# Patient Record
Sex: Female | Born: 1985 | Race: White | Hispanic: No | Marital: Single | Smoking: Never smoker
Health system: Southern US, Community
[De-identification: ages and names within clinical notes are randomized; demographics above are authoritative.]

## PROBLEM LIST (undated history)

## (undated) DIAGNOSIS — E559 Vitamin D deficiency, unspecified: Secondary | ICD-10-CM

## (undated) DIAGNOSIS — M549 Dorsalgia, unspecified: Secondary | ICD-10-CM

## (undated) DIAGNOSIS — R0602 Shortness of breath: Secondary | ICD-10-CM

## (undated) DIAGNOSIS — R600 Localized edema: Secondary | ICD-10-CM

## (undated) DIAGNOSIS — R011 Cardiac murmur, unspecified: Secondary | ICD-10-CM

## (undated) DIAGNOSIS — E119 Type 2 diabetes mellitus without complications: Secondary | ICD-10-CM

## (undated) DIAGNOSIS — R7303 Prediabetes: Secondary | ICD-10-CM

## (undated) DIAGNOSIS — I10 Essential (primary) hypertension: Secondary | ICD-10-CM

## (undated) HISTORY — DX: Morbid (severe) obesity due to excess calories: E66.01

## (undated) HISTORY — DX: Type 2 diabetes mellitus without complications: E11.9

## (undated) HISTORY — DX: Hypomagnesemia: E83.42

## (undated) HISTORY — DX: Localized edema: R60.0

## (undated) HISTORY — DX: Prediabetes: R73.03

## (undated) HISTORY — DX: Essential (primary) hypertension: I10

## (undated) HISTORY — PX: CHOLECYSTECTOMY: SHX55

## (undated) HISTORY — DX: Shortness of breath: R06.02

## (undated) HISTORY — DX: Cardiac murmur, unspecified: R01.1

## (undated) HISTORY — DX: Vitamin D deficiency, unspecified: E55.9

## (undated) HISTORY — DX: Dorsalgia, unspecified: M54.9

---
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
## 2007-05-11 ENCOUNTER — Emergency Department (HOSPITAL_COMMUNITY): Admission: EM | Admit: 2007-05-11 | Discharge: 2007-05-11 | Payer: Self-pay | Admitting: *Deleted

## 2008-05-21 ENCOUNTER — Encounter (INDEPENDENT_AMBULATORY_CARE_PROVIDER_SITE_OTHER): Payer: Self-pay | Admitting: Internal Medicine

## 2008-05-21 ENCOUNTER — Ambulatory Visit: Payer: Self-pay

## 2008-08-15 ENCOUNTER — Emergency Department (HOSPITAL_COMMUNITY): Admission: EM | Admit: 2008-08-15 | Discharge: 2008-08-15 | Payer: Self-pay | Admitting: Emergency Medicine

## 2009-04-25 LAB — HM PAP SMEAR: HM Pap smear: NORMAL

## 2009-06-02 ENCOUNTER — Other Ambulatory Visit: Admission: RE | Admit: 2009-06-02 | Discharge: 2009-06-02 | Payer: Self-pay | Admitting: Internal Medicine

## 2010-07-29 LAB — URINALYSIS, ROUTINE W REFLEX MICROSCOPIC
Bilirubin Urine: NEGATIVE
Specific Gravity, Urine: 1.033 — ABNORMAL HIGH (ref 1.005–1.030)
Urobilinogen, UA: 0.2 mg/dL (ref 0.0–1.0)
pH: 5.5 (ref 5.0–8.0)

## 2010-07-29 LAB — BASIC METABOLIC PANEL
BUN: 9 mg/dL (ref 6–23)
CO2: 28 mEq/L (ref 19–32)
GFR calc non Af Amer: >60 mL/min (ref >60)
Glucose, Bld: 102 mg/dL — ABNORMAL HIGH (ref 70–99)
Potassium: 4.1 mEq/L (ref 3.5–5.1)
Sodium: 140 mEq/L (ref 135–145)

## 2010-09-04 NOTE — Op Note (Signed)
Kathryn Chan, Kathryn Chan              ACCOUNT NO.:  [id number]   MEDICAL RECORD NO.:  [id number]          PATIENT TYPE:  OIB   LOCATION:  2899                         FACILITY:  [facility]   PHYSICIAN:  Currie Paris, M.D.DATE OF BIRTH:  Apr 18, 1986   DATE OF PROCEDURE:  10/13/2004  DATE OF DISCHARGE:                                 OPERATIVE REPORT   [facility]S [phone].   PREOPERATIVE DIAGNOSIS:  Chronic calculus cholecystitis.   POSTOPERATIVE DIAGNOSIS:  Chronic calculous cholecystitis.   OPERATION:  Laparoscopic cholecystectomy with intraoperative cholangiogram.   SURGEON:  Dr. Jamey Ripa.   ASSISTANT:  Dr. Johna Sheriff.   ANESTHESIA:  General endotracheal.   CLINICAL HISTORY:  This a 25 year old who has had some significant weight  loss and developed  biliary type symptoms. Ultrasound showed what appeared  be some small stones in the gallbladder. After discussion with the patient  and her parents, we elected to proceed to cholecystectomy.   DESCRIPTION OF PROCEDURE:  The patient and her parents seen in the holding  area and we confirmed removal of the gallbladder as the planned procedure.  All questions had been answered and she was ready for surgery.   The patient taken to th operating room and after satisfactory general  endotracheal anesthesia obtained, the abdomen was prepped and draped. The  time-out occurred.   I used 0.25% plain Marcaine for each incision. I made an umbilical incision,  identified the fascia and opened direct vision. The peritoneal cavity was  entered and a pursestring placed in the fascia. The Hasson was introduced  and the abdomen insufflated to 15.   With the camera in place, we could see that there appeared to be no gross  abnormalities. Additional cannulas were placed in the epigastrium and right  mid abdomen.   The gallbladder retracted over the liver. The perineum over the cystic duct  opened and dissected out so we could identify the triangle of Calot.   I  could see the cystic duce nicely identified with the junction both with the  gallbladder and common duct. I could see the top of the cystic artery but  did not further dissect it out once we had a good window and could see that  we had the cystic duct nicely  dissected out.   Cystic duct was clipped once at its junction with the gallbladder and  opened. A Cook catheter was introduced and operative cholangiography  performed. This showed a fairly long cystic duct, good filling of the common  bile duct, common hepatic duct, with no filling defects, good emptying into  the duodenum and a little bit of filling of the pancreatic duct. This was  thought to be normal cholangiogram.   Catheter was removed and three clips placed on the cystic duct and it was  divided. Some small stranding tissue was clipped with single clips until the  cystic artery was dissected out and this was triple clipped and divided. The  gallbladder was removed from below to above with coagulation current of the  cautery. Just prior to disconnecting it, we checked the bed of gallbladder  and  made sure everything was dry. Made sure the clips appeared to be in good  position, etc. and everything looked fine.   The gallbladder was disconnected and brought out the umbilical port. The  umbilical port was occluded for few moments while we made a final check for  hemostasis and again everything appeared dry. The lateral ports removed and  there was no bleeding. The umbilical port was closed with a pursestring. The  abdomen was deflated through the epigastric port. Skin was closed with 4-0  Monocryl subcuticular and dermabond.   The patient tolerated the procedure well. No operative complications. All  counts were correct.       CJS/MEDQ  D:  10/13/2004  T:  10/13/2004  Job:  [id number]   cc:   Lucky Cowboy, M.D.  [address]  [place], [place] [address]  Fax: [phone]

## 2011-01-07 LAB — URINALYSIS, ROUTINE W REFLEX MICROSCOPIC
Ketones, ur: NEGATIVE
Nitrite: NEGATIVE
Protein, ur: NEGATIVE
Urobilinogen, UA: 0.2

## 2011-01-07 LAB — BASIC METABOLIC PANEL
CO2: 28
Calcium: 9
Creatinine, Ser: 0.62
Glucose, Bld: 96

## 2011-01-07 LAB — CBC
MCV: 90.1
Platelets: 250
RBC: 4.11
WBC: 7.8

## 2013-02-23 ENCOUNTER — Encounter: Payer: Self-pay | Admitting: Internal Medicine

## 2013-02-23 DIAGNOSIS — R7303 Prediabetes: Secondary | ICD-10-CM | POA: Insufficient documentation

## 2013-02-23 DIAGNOSIS — E669 Obesity, unspecified: Secondary | ICD-10-CM | POA: Insufficient documentation

## 2013-02-23 DIAGNOSIS — I10 Essential (primary) hypertension: Secondary | ICD-10-CM | POA: Insufficient documentation

## 2013-02-23 DIAGNOSIS — E559 Vitamin D deficiency, unspecified: Secondary | ICD-10-CM | POA: Insufficient documentation

## 2013-02-26 ENCOUNTER — Ambulatory Visit: Payer: 59 | Admitting: Physician Assistant

## 2013-06-16 ENCOUNTER — Other Ambulatory Visit: Payer: Self-pay | Admitting: Physician Assistant

## 2013-06-19 ENCOUNTER — Telehealth: Payer: Self-pay | Admitting: *Deleted

## 2013-06-19 ENCOUNTER — Other Ambulatory Visit: Payer: Self-pay

## 2013-06-19 NOTE — Telephone Encounter (Signed)
Return call to patient and left message on machine, advised her to call pharmacy as these prescriptions have already been processed

## 2013-06-19 NOTE — Telephone Encounter (Signed)
NEEDS REFILL = LISINOPRIL/HCTZ & LEVOTHYROXINE

## 2013-07-02 ENCOUNTER — Ambulatory Visit: Payer: Self-pay | Admitting: Physician Assistant

## 2013-07-06 ENCOUNTER — Encounter: Payer: Self-pay | Admitting: Physician Assistant

## 2013-07-17 ENCOUNTER — Encounter: Payer: Self-pay | Admitting: Physician Assistant

## 2013-07-24 ENCOUNTER — Other Ambulatory Visit: Payer: Self-pay | Admitting: Physician Assistant

## 2013-07-24 ENCOUNTER — Telehealth: Payer: Self-pay

## 2013-07-24 MED ORDER — PHENTERMINE HCL 37.5 MG PO TABS: 37.5000 mg | ORAL_TABLET | Freq: Every day | ORAL | Status: DC

## 2013-07-24 NOTE — Telephone Encounter (Signed)
Pt called requesting Rx for Phentermine. She said you told her to call today for Rx. [facility]

## 2013-08-07 ENCOUNTER — Ambulatory Visit (INDEPENDENT_AMBULATORY_CARE_PROVIDER_SITE_OTHER): Payer: 59 | Admitting: Physician Assistant

## 2013-08-07 ENCOUNTER — Encounter: Payer: Self-pay | Admitting: Physician Assistant

## 2013-08-07 VITALS — BP 128/72 | HR 76 | Temp 98.2°F | Resp 16 | Ht 67.0 in | Wt >= 6400 oz

## 2013-08-07 DIAGNOSIS — E559 Vitamin D deficiency, unspecified: Secondary | ICD-10-CM

## 2013-08-07 DIAGNOSIS — R7309 Other abnormal glucose: Secondary | ICD-10-CM

## 2013-08-07 DIAGNOSIS — R7303 Prediabetes: Secondary | ICD-10-CM

## 2013-08-07 DIAGNOSIS — I10 Essential (primary) hypertension: Secondary | ICD-10-CM

## 2013-08-07 LAB — CBC WITH DIFFERENTIAL/PLATELET
BASOS ABS: 0 10*3/uL (ref 0.0–0.1)
BASOS PCT: 0 % (ref 0–1)
EOS ABS: 0.2 10*3/uL (ref 0.0–0.7)
EOS PCT: 2 % (ref 0–5)
HCT: 40.5 % (ref 36.0–46.0)
Hemoglobin: 13.8 g/dL (ref 12.0–15.0)
Lymphocytes Relative: 29 % (ref 12–46)
Lymphs Abs: 2.5 10*3/uL (ref 0.7–4.0)
MCH: 29.9 pg (ref 26.0–34.0)
MCHC: 34.1 g/dL (ref 30.0–36.0)
MCV: 87.9 fL (ref 78.0–100.0)
Monocytes Absolute: 0.4 10*3/uL (ref 0.1–1.0)
Monocytes Relative: 5 % (ref 3–12)
Neutro Abs: 5.5 10*3/uL (ref 1.7–7.7)
Neutrophils Relative %: 64 % (ref 43–77)
PLATELETS: 315 10*3/uL (ref 150–400)
RBC: 4.61 MIL/uL (ref 3.87–5.11)
RDW: 13.4 % (ref 11.5–15.5)
WBC: 8.6 10*3/uL (ref 4.0–10.5)

## 2013-08-07 MED ORDER — LISINOPRIL-HYDROCHLOROTHIAZIDE 20-25 MG PO TABS: ORAL_TABLET | ORAL | Status: DC

## 2013-08-07 MED ORDER — LEVOTHYROXINE SODIUM 50 MCG PO TABS: ORAL_TABLET | ORAL | Status: DC

## 2013-08-07 MED ORDER — PHENTERMINE HCL 37.5 MG PO TABS: 37.5000 mg | ORAL_TABLET | Freq: Every day | ORAL | Status: DC

## 2013-08-07 NOTE — Patient Instructions (Signed)
Bad carbs also include fruit juice, alcohol, and sweet tea. These are empty calories that do not signal to your brain that you are full.   Please remember the good carbs are still carbs which convert into sugar. So please measure them out no more than 1/2-1 cup of rice, oatmeal, pasta, and beans.  Veggies are however free foods! Pile them on.   I like lean protein at every meal such as chicken, [place], pork chops, cottage cheese, etc. Just do not fry these meats and please center your meal around vegetable, the meats should be a side dish.   No all fruit is created equal. Please see the list below, the fruit at the bottom is higher in sugars than the fruit at the top   Phentermine  While taking the medication we will ask that you come into the office once a month to monitor your weight, blood pressure, and heart rate. In addition we can help answer your questions about diet, exercise, and help you every step of the way with your weight loss journey. Sometime it is helpful if you bring in a food diary or use an app on your phone such as myfitnesspal to record your calorie intake, especially in the beginning.   You can start out on 1/3 to 1/2 a pill in the morning and if you are tolerating it well you can increase to one pill daily.   What is this medicine? PHENTERMINE (FEN ter meen) decreases your appetite. This medicine is intended to be used in addition to a healthy reduced calorie diet and exercise. The best results are achieved this way. This medicine is only indicated for short-term use. Eventually your weight loss may level out and the medication will no longer be needed.   How should I use this medicine? Take this medicine by mouth. Follow the directions on the prescription label. The tablets should stay in the bottle until immediately before you take your dose. Take your doses at regular intervals. Do not take your medicine more often than directed.  Overdosage: If you think you have  taken too much of this medicine contact a poison control center or emergency room at once. NOTE: This medicine is only for you. Do not share this medicine with others.  What if I miss a dose? If you miss a dose, take it as soon as you can. If it is almost time for your next dose, take only that dose. Do not take double or extra doses. Do not increase or in any way change your dose without consulting your doctor.  What should I watch for while using this medicine? Notify your physician immediately if you become short of breath while doing your normal activities. Do not take this medicine within 6 hours of bedtime. It can keep you from getting to sleep. Avoid drinks that contain caffeine and try to stick to a regular bedtime every night. Do not stand or sit up quickly, especially if you are an older patient. This reduces the risk of dizzy or fainting spells. Avoid alcoholic drinks.  What side effects may I notice from receiving this medicine? Side effects that you should report to your doctor or health care professional as soon as possible: -chest pain, palpitations -depression or severe changes in mood -increased blood pressure -irritability -nervousness or restlessness -severe dizziness -shortness of breath -problems urinating -unusual swelling of the legs -vomiting  Side effects that usually do not require medical attention (report to your doctor or health  care professional if they continue or are bothersome): -blurred vision or other eye problems -changes in sexual ability or desire -constipation or diarrhea -difficulty sleeping -dry mouth or unpleasant taste -headache -nausea This list may not describe all possible side effects. Call your doctor for medical advice about side effects. You may report side effects to FDA at 1-800-FDA-1088.

## 2013-08-07 NOTE — Progress Notes (Signed)
HPI 28 y.o. female  presents for 3 month follow up with hypertension, hyperlipidemia, prediabetes and vitamin D. Her blood pressure has been controlled at home, today [name] is BP: 128/72 mmHg She does not workout at this time. She denies chest pain, shortness of breath, dizziness.  She is not on cholesterol medication and denies myalgias. Her cholesterol is at goal. The cholesterol last visit was:   She has been working on diet and exercise for prediabetes, and denies paresthesia of the feet, polydipsia, polyuria and visual disturbances. Last A1C in the office was:  Patient is on Vitamin D supplement.   She has morbid obesity, Body mass index is 63.89 kg/(m^2). and we started her on phentermine 2 weeks ago. She was unable to afford other weight loss medications and I was hesitant to put her on phentermine because she states in the past she has low potassium on it however we decided to put her on it for 2 weeks, she has only been on a half and we will check her labs today. She states she is feeling great, no increased palps, CP, SOB, dizziness, edema, muscle cramps. She has lost 11 lbs so far.  She is on thyroid medication. Her medication was not changed last visit. Patient denies heat / cold intolerance, nervousness and palpitations.    Current Medications:  Current Outpatient Prescriptions on File Prior to Visit  Medication Sig Dispense Refill  . cholecalciferol (VITAMIN D) 1000 UNITS tablet Take 1,000 Units by mouth daily.      Marland Kitchen levothyroxine (SYNTHROID, LEVOTHROID) 50 MCG tablet TAKE ONE-HALF TABLET BY MOUTH ONCE DAILY  30 tablet  1  . lisinopril-hydrochlorothiazide (PRINZIDE,ZESTORETIC) 20-25 MG per tablet TAKE ONE TABLET BY MOUTH ONCE DAILY  90 tablet  0  . Multiple Vitamin (MULTIVITAMIN) capsule Take 1 capsule by mouth daily.      . phentermine (ADIPEX-P) 37.5 MG tablet Take 1 tablet (37.5 mg total) by mouth daily before breakfast.  30 tablet  0   No current facility-administered  medications on file prior to visit.   Medical History:  Past Medical History  Diagnosis Date  . Prediabetes   . Vitamin D deficiency   . Hypertension   . Obesity, morbid, BMI 50 or higher   . Hypomagnesemia    Allergies:  Allergies  Allergen Reactions  . Phentermine     Hyperkalemia     Review of Systems: [X]  = complains of  [ ]  = denies  General: Fatigue [ ]  Fever [ ]  Chills [ ]  Weakness [ ]   Insomnia [ ]  Eyes: Redness [ ]  Blurred vision [ ]  Diplopia [ ]   ENT: Congestion [ ]  Sinus Pain [ ]  Post Nasal Drip [ ]  Sore Throat [ ]  Earache [ ]   Cardiac: Chest pain/pressure [ ]  SOB [ ]  Orthopnea [ ]   Palpitations [ ]   Paroxysmal nocturnal dyspnea[ ]  Claudication [ ]  Edema [ ]   Pulmonary: Cough [ ]  Wheezing[ ]   SOB [ ]   Snoring [ ]   GI: Nausea [ ]  Vomiting[ ]  Dysphagia[ ]  Heartburn[ ]  Abdominal pain [ ]  Constipation [ ] ; Diarrhea [ ] ; BRBPR [ ]  Melena[ ]  GU: Hematuria[ ]  Dysuria [ ]  Nocturia[ ]  Urgency [ ]   Hesitancy [ ]  Discharge [ ]  Neuro: Headaches[ ]  Vertigo[ ]  Paresthesias[ ]  Spasm [ ]  Speech changes [ ]  Incoordination [ ]   Ortho: Arthritis [ ]  Joint pain [ ]  Muscle pain [ ]  Joint swelling [ ]  Back Pain [ ]  Skin:  Rash [ ]   Pruritis [ ]  Change in skin lesion [ ]   Psych: Depression[ ]  Anxiety[ ]  Confusion [ ]  Memory loss [ ]   Heme/Lypmh: Bleeding [ ]  Bruising [ ]  Enlarged lymph nodes [ ]   Endocrine: Visual blurring [ ]  Paresthesia [ ]  Polyuria [ ]  Polydypsea [ ]    Heat/cold intolerance [ ]  Hypoglycemia [ ]   Family history- Review and unchanged Social history- Review and unchanged Physical Exam: BP 128/72  Pulse 76  Temp(Src) 98.2 F (36.8 C)  Resp 16  Ht 5\' 7"  (1.702 m)  Wt 408 lb (185.068 kg)  BMI 63.89 kg/m2  LMP 07/31/2013 Wt Readings from Last 3 Encounters:  08/07/13 408 lb (185.068 kg)   General Appearance: Well nourished, in no apparent distress. Eyes: PERRLA, EOMs, conjunctiva no swelling or erythema Sinuses: No Frontal/maxillary tenderness ENT/Mouth: Ext aud  canals clear, TMs without erythema, bulging. No erythema, swelling, or exudate on post pharynx.  Tonsils not swollen or erythematous. Hearing normal.  Neck: Supple, thyroid normal.  Respiratory: Respiratory effort normal, BS equal bilaterally without rales, rhonchi, wheezing or stridor.  Cardio: RRR with no MRGs. Brisk peripheral pulses without edema.  Abdomen: Soft, + BS.  Non tender, no guarding, rebound, hernias, masses. Lymphatics: Non tender without lymphadenopathy.  Musculoskeletal: Full ROM, 5/5 strength, normal gait.  Skin: Warm, dry without rashes, lesions, ecchymosis.  Neuro: Cranial nerves intact. Normal muscle tone, no cerebellar symptoms. Sensation intact.  Psych: Awake and oriented X 3, normal affect, Insight and Judgment appropriate.   Assessment and Plan:  Hypertension: Continue medication, monitor blood pressure at home. Continue DASH diet. Cholesterol: Continue diet and exercise. Check cholesterol.  Pre-diabetes-Continue diet and exercise. Check A1C Vitamin D Def- check level and continue medications.  Hypothyroidism-check TSH level, continue medications the same.  Obesity with co morbidities- long discussion about weight loss, diet, and exercise  Will start the patient on phentermine- hand out given  Follow up in 1 month with food diary   Continue diet and meds as discussed. Further disposition pending results of labs.  Vicie Mutters 3:26 PM

## 2013-08-08 LAB — VITAMIN D 25 HYDROXY (VIT D DEFICIENCY, FRACTURES): VIT D 25 HYDROXY: 51 ng/mL (ref 30–89)

## 2013-08-08 LAB — BASIC METABOLIC PANEL WITH GFR
BUN: 10 mg/dL (ref 6–23)
CALCIUM: 9.9 mg/dL (ref 8.4–10.5)
CO2: 29 meq/L (ref 19–32)
CREATININE: 0.59 mg/dL (ref 0.50–1.10)
Chloride: 96 mEq/L (ref 96–112)
GFR, Est African American: >89 mL/min
GFR, Est Non African American: >89 mL/min
Glucose, Bld: 86 mg/dL (ref 70–99)
Potassium: 4 mEq/L (ref 3.5–5.3)
Sodium: 135 mEq/L (ref 135–145)

## 2013-08-08 LAB — HEPATIC FUNCTION PANEL
ALBUMIN: 4.2 g/dL (ref 3.5–5.2)
ALT: 45 U/L — ABNORMAL HIGH (ref 0–35)
AST: 35 U/L (ref 0–37)
Alkaline Phosphatase: 45 U/L (ref 39–117)
BILIRUBIN TOTAL: 0.4 mg/dL (ref 0.2–1.2)
Bilirubin, Direct: 0.1 mg/dL (ref 0.0–0.3)
Indirect Bilirubin: 0.3 mg/dL (ref 0.2–1.2)
Total Protein: 7.1 g/dL (ref 6.0–8.3)

## 2013-08-08 LAB — MAGNESIUM: MAGNESIUM: 1.7 mg/dL (ref 1.5–2.5)

## 2013-08-08 LAB — LIPID PANEL
CHOL/HDL RATIO: 2.8 ratio
CHOLESTEROL: 118 mg/dL (ref 0–200)
HDL: 42 mg/dL (ref >39)
LDL Cholesterol: 57 mg/dL (ref 0–99)
Triglycerides: 94 mg/dL (ref <150)
VLDL: 19 mg/dL (ref 0–40)

## 2013-08-08 LAB — TSH: TSH: 1.513 u[IU]/mL (ref 0.350–4.500)

## 2013-08-08 LAB — HEMOGLOBIN A1C
HEMOGLOBIN A1C: 5.8 % — AB (ref <5.7)
MEAN PLASMA GLUCOSE: 120 mg/dL — AB (ref <117)

## 2013-08-08 LAB — INSULIN, FASTING: Insulin fasting, serum: 37 u[IU]/mL — ABNORMAL HIGH (ref 3–28)

## 2013-09-03 ENCOUNTER — Ambulatory Visit (INDEPENDENT_AMBULATORY_CARE_PROVIDER_SITE_OTHER): Payer: 59 | Admitting: Physician Assistant

## 2013-09-03 ENCOUNTER — Encounter: Payer: Self-pay | Admitting: Physician Assistant

## 2013-09-03 VITALS — BP 138/80 | HR 80 | Temp 98.1°F | Resp 16 | Wt 393.0 lb

## 2013-09-03 DIAGNOSIS — I10 Essential (primary) hypertension: Secondary | ICD-10-CM

## 2013-09-03 DIAGNOSIS — E559 Vitamin D deficiency, unspecified: Secondary | ICD-10-CM

## 2013-09-03 DIAGNOSIS — R7309 Other abnormal glucose: Secondary | ICD-10-CM

## 2013-09-03 DIAGNOSIS — R7303 Prediabetes: Secondary | ICD-10-CM

## 2013-09-03 LAB — HEPATIC FUNCTION PANEL
ALT: 52 U/L — ABNORMAL HIGH (ref 0–35)
AST: 44 U/L — ABNORMAL HIGH (ref 0–37)
Albumin: 4.3 g/dL (ref 3.5–5.2)
Alkaline Phosphatase: 47 U/L (ref 39–117)
Bilirubin, Direct: 0.1 mg/dL (ref 0.0–0.3)
Indirect Bilirubin: 0.4 mg/dL (ref 0.2–1.2)
Total Bilirubin: 0.5 mg/dL (ref 0.2–1.2)
Total Protein: 7.3 g/dL (ref 6.0–8.3)

## 2013-09-03 LAB — BASIC METABOLIC PANEL WITH GFR
BUN: 8 mg/dL (ref 6–23)
CHLORIDE: 99 meq/L (ref 96–112)
CO2: 29 mEq/L (ref 19–32)
Calcium: 10 mg/dL (ref 8.4–10.5)
Creat: 0.75 mg/dL (ref 0.50–1.10)
GFR, Est African American: >89 mL/min
Glucose, Bld: 95 mg/dL (ref 70–99)
POTASSIUM: 4.3 meq/L (ref 3.5–5.3)
Sodium: 137 mEq/L (ref 135–145)

## 2013-09-03 MED ORDER — LISINOPRIL-HYDROCHLOROTHIAZIDE 20-25 MG PO TABS: ORAL_TABLET | ORAL | Status: DC

## 2013-09-03 MED ORDER — PHENTERMINE HCL 37.5 MG PO TABS: 37.5000 mg | ORAL_TABLET | Freq: Every day | ORAL | Status: DC

## 2013-09-03 MED ORDER — LEVOTHYROXINE SODIUM 50 MCG PO TABS: ORAL_TABLET | ORAL | Status: DC

## 2013-09-03 NOTE — Progress Notes (Signed)
28 y.o.female presents for a follow up after being on phentermine for weight loss for 2 months. Patient states they have walking, fried to grill, twice the amount of water,doing myfitness pal, trying to pay attention to being full.  While on the phentermine they have lost 27 lbs since last visit. They deny palpitations, anxiety, trouble sleeping, elevated BP. She states her breathing and energy has improved.  Wt Readings from Last 3 Encounters:  09/03/13 393 lb (178.264 kg)  08/07/13 408 lb (185.068 kg)   Typical breakfast: still working on trying to do breakfast, boiled egg, protein oatmeal, yogurt, whole wheat english muffins Typical lunch: deli [place], thin cheeses, tomatoes, fruit, left overs from night before Typical dinner: fish and veggie with brown rice, grilled chicken, baked spaghetti.   Medications: Current Outpatient Prescriptions on File Prior to Visit  Medication Sig Dispense Refill  . cholecalciferol (VITAMIN D) 1000 UNITS tablet Take 1,000 Units by mouth daily.      Marland Kitchen levothyroxine (SYNTHROID, LEVOTHROID) 50 MCG tablet TAKE ONE-HALF TABLET BY MOUTH ONCE DAILY  90 tablet  0  . lisinopril-hydrochlorothiazide (PRINZIDE,ZESTORETIC) 20-25 MG per tablet TAKE ONE TABLET BY MOUTH ONCE DAILY  90 tablet  0  . Multiple Vitamin (MULTIVITAMIN) capsule Take 1 capsule by mouth daily.      . phentermine (ADIPEX-P) 37.5 MG tablet Take 1 tablet (37.5 mg total) by mouth daily before breakfast.  30 tablet  0   No current facility-administered medications on file prior to visit.    ROS: All negative except for above  Physical exam: Filed Vitals:   09/03/13 1204  BP: 138/80  Pulse: 80  Temp: 98.1 F (36.7 C)  Resp: 16   BP 138/80  Pulse 80  Temp(Src) 98.1 F (36.7 C)  Resp 16  Wt 393 lb (178.264 kg)  LMP 07/31/2013 General appearance: alert Throat: lips, mucosa, and tongue normal; teeth and gums normal Neck: no adenopathy, no carotid bruit, no JVD, supple, symmetrical, trachea  midline and thyroid not enlarged, symmetric, no tenderness/mass/nodules Lungs: clear to auscultation bilaterally Abdomen: soft, non-tender; bowel sounds normal; no masses,  no organomegaly  Assessment: Obesity with co morbid conditions.   Plan: General weight loss/lifestyle modification strategies discussed (elicit support from others; identify saboteurs; non-food rewards, etc). Behavioral treatment: stress management. Diet interventions: diet diary indefinitely. Informal exercise measures discussed, e.g. taking stairs instead of elevator. Regular aerobic exercise program discussed. Medication: phentermine. Follow up in: 2 months and as needed.

## 2013-09-03 NOTE — Patient Instructions (Signed)

## 2013-09-04 ENCOUNTER — Ambulatory Visit: Payer: Self-pay | Admitting: Physician Assistant

## 2013-11-07 ENCOUNTER — Ambulatory Visit: Payer: Self-pay | Admitting: Physician Assistant

## 2013-12-17 ENCOUNTER — Other Ambulatory Visit: Payer: Self-pay | Admitting: Physician Assistant

## 2013-12-27 ENCOUNTER — Ambulatory Visit: Payer: Self-pay | Admitting: Physician Assistant

## 2014-02-01 ENCOUNTER — Ambulatory Visit: Payer: Self-pay | Admitting: Physician Assistant

## 2014-02-08 ENCOUNTER — Ambulatory Visit (INDEPENDENT_AMBULATORY_CARE_PROVIDER_SITE_OTHER): Payer: 59 | Admitting: Physician Assistant

## 2014-02-08 ENCOUNTER — Encounter: Payer: Self-pay | Admitting: Physician Assistant

## 2014-02-08 VITALS — BP 128/82 | HR 80 | Temp 98.1°F | Resp 16 | Ht 67.0 in | Wt 394.0 lb

## 2014-02-08 DIAGNOSIS — E559 Vitamin D deficiency, unspecified: Secondary | ICD-10-CM

## 2014-02-08 DIAGNOSIS — R7309 Other abnormal glucose: Secondary | ICD-10-CM

## 2014-02-08 DIAGNOSIS — E785 Hyperlipidemia, unspecified: Secondary | ICD-10-CM

## 2014-02-08 DIAGNOSIS — R7303 Prediabetes: Secondary | ICD-10-CM

## 2014-02-08 DIAGNOSIS — I10 Essential (primary) hypertension: Secondary | ICD-10-CM

## 2014-02-08 LAB — CBC WITH DIFFERENTIAL/PLATELET
Basophils Absolute: 0 10*3/uL (ref 0.0–0.1)
Basophils Relative: 0 % (ref 0–1)
Eosinophils Absolute: 0.2 10*3/uL (ref 0.0–0.7)
Eosinophils Relative: 2 % (ref 0–5)
HEMATOCRIT: 40.4 % (ref 36.0–46.0)
HEMOGLOBIN: 13.3 g/dL (ref 12.0–15.0)
LYMPHS PCT: 30 % (ref 12–46)
Lymphs Abs: 2.7 10*3/uL (ref 0.7–4.0)
MCH: 29.8 pg (ref 26.0–34.0)
MCHC: 32.9 g/dL (ref 30.0–36.0)
MCV: 90.6 fL (ref 78.0–100.0)
MONO ABS: 0.5 10*3/uL (ref 0.1–1.0)
MONOS PCT: 5 % (ref 3–12)
NEUTROS ABS: 5.7 10*3/uL (ref 1.7–7.7)
Neutrophils Relative %: 63 % (ref 43–77)
Platelets: 346 10*3/uL (ref 150–400)
RBC: 4.46 MIL/uL (ref 3.87–5.11)
RDW: 13.3 % (ref 11.5–15.5)
WBC: 9 10*3/uL (ref 4.0–10.5)

## 2014-02-08 LAB — BASIC METABOLIC PANEL WITH GFR
BUN: 12 mg/dL (ref 6–23)
CHLORIDE: 99 meq/L (ref 96–112)
CO2: 29 mEq/L (ref 19–32)
Calcium: 9.7 mg/dL (ref 8.4–10.5)
Creat: 0.63 mg/dL (ref 0.50–1.10)
GFR, Est African American: >89 mL/min
GLUCOSE: 85 mg/dL (ref 70–99)
POTASSIUM: 4.3 meq/L (ref 3.5–5.3)
Sodium: 136 mEq/L (ref 135–145)

## 2014-02-08 LAB — HEPATIC FUNCTION PANEL
ALK PHOS: 51 U/L (ref 39–117)
ALT: 25 U/L (ref 0–35)
AST: 22 U/L (ref 0–37)
Albumin: 4.1 g/dL (ref 3.5–5.2)
BILIRUBIN DIRECT: 0.1 mg/dL (ref 0.0–0.3)
BILIRUBIN INDIRECT: 0.4 mg/dL (ref 0.2–1.2)
TOTAL PROTEIN: 6.7 g/dL (ref 6.0–8.3)
Total Bilirubin: 0.5 mg/dL (ref 0.2–1.2)

## 2014-02-08 LAB — LIPID PANEL
Cholesterol: 124 mg/dL (ref 0–200)
HDL: 47 mg/dL (ref >39)
LDL CALC: 59 mg/dL (ref 0–99)
Total CHOL/HDL Ratio: 2.6 Ratio
Triglycerides: 88 mg/dL (ref <150)
VLDL: 18 mg/dL (ref 0–40)

## 2014-02-08 LAB — HEMOGLOBIN A1C
Hgb A1c MFr Bld: 5.7 % — ABNORMAL HIGH (ref <5.7)
Mean Plasma Glucose: 117 mg/dL — ABNORMAL HIGH (ref <117)

## 2014-02-08 LAB — MAGNESIUM: Magnesium: 1.6 mg/dL (ref 1.5–2.5)

## 2014-02-08 LAB — TSH: TSH: 1.474 u[IU]/mL (ref 0.350–4.500)

## 2014-02-08 MED ORDER — PHENTERMINE HCL 37.5 MG PO TABS: 37.5000 mg | ORAL_TABLET | Freq: Every day | ORAL | Status: DC

## 2014-02-08 MED ORDER — LISINOPRIL-HYDROCHLOROTHIAZIDE 20-25 MG PO TABS: ORAL_TABLET | ORAL | Status: DC

## 2014-02-08 MED ORDER — LEVOTHYROXINE SODIUM 50 MCG PO TABS: ORAL_TABLET | ORAL | Status: DC

## 2014-02-08 NOTE — Progress Notes (Signed)
Assessment and Plan:  Hypertension: Continue medication, monitor blood pressure at home. Continue DASH diet.  Reminder to go to the ER if any CP, SOB, nausea, dizziness, severe HA, changes vision/speech, left arm numbness and tingling, and jaw pain. Cholesterol: Continue diet and exercise. Check cholesterol.  Pre-diabetes-Continue diet and exercise. Check A1C Vitamin D Def- check level and continue medications.  Obesity with co morbidities- long discussion about weight loss, diet, and exercise. Phentermine sent in   Continue diet and meds as discussed. Further disposition pending results of labs.  HPI 28 y.o. female  presents for 3 month follow up with hypertension, hyperlipidemia, prediabetes and vitamin D. Her blood pressure has been controlled at home, today [name] is BP: 128/82 mmHg She does workout. She denies chest pain, shortness of breath, dizziness.  She is not on cholesterol medication and denies myalgias. Her cholesterol is at goal. The cholesterol last visit was:   Lab Results  Component Value Date   CHOL 118 08/07/2013   HDL 42 08/07/2013   LDLCALC 57 08/07/2013   TRIG 94 08/07/2013   CHOLHDL 2.8 08/07/2013   She has been working on diet and exercise for prediabetes, and denies paresthesia of the feet, polydipsia, polyuria and visual disturbances. Last A1C in the office was:  Lab Results  Component Value Date   HGBA1C 5.8* 08/07/2013   Patient is on Vitamin D supplement.   Lab Results  Component Value Date   VD25OH 51 08/07/2013     BMI is Body mass index is 61.69 kg/(m^2)., she is working on diet and exercise and has done well, she has a fit bit, she is walking 6000 steps a day up from 3500,   Wt Readings from Last 3 Encounters:  02/08/14 394 lb (178.717 kg)  09/03/13 393 lb (178.264 kg)  08/07/13 408 lb (185.068 kg)    Current Medications:  Current Outpatient Prescriptions on File Prior to Visit  Medication Sig Dispense Refill  . cholecalciferol (VITAMIN D) 1000  UNITS tablet Take 1,000 Units by mouth daily.      Marland Kitchen levothyroxine (SYNTHROID, LEVOTHROID) 50 MCG tablet TAKE ONE-HALF TABLET BY MOUTH ONCE DAILY  90 tablet  0  . lisinopril-hydrochlorothiazide (PRINZIDE,ZESTORETIC) 20-25 MG per tablet TAKE ONE TABLET BY MOUTH ONCE DAILY  90 tablet  0  . Multiple Vitamin (MULTIVITAMIN) capsule Take 1 capsule by mouth daily.      . phentermine (ADIPEX-P) 37.5 MG tablet Take 1 tablet (37.5 mg total) by mouth daily before breakfast.  30 tablet  2   No current facility-administered medications on file prior to visit.   Medical History:  Past Medical History  Diagnosis Date  . Prediabetes   . Vitamin D deficiency   . Hypertension   . Obesity, morbid, BMI 50 or higher   . Hypomagnesemia    Allergies:  Allergies  Allergen Reactions  . Phentermine     Hyperkalemia     Review of Systems: [X]  = complains of  [ ]  = denies  General: Fatigue [ ]  Fever [ ]  Chills [ ]  Weakness [ ]   Insomnia [ ]  Eyes: Redness [ ]  Blurred vision [ ]  Diplopia [ ]   ENT: Congestion [ ]  Sinus Pain [ ]  Post Nasal Drip [ ]  Sore Throat [ ]  Earache [ ]   Cardiac: Chest pain/pressure [ ]  SOB [ ]  Orthopnea [ ]   Palpitations [ ]   Paroxysmal nocturnal dyspnea[ ]  Claudication [ ]  Edema [ ]   Pulmonary: Cough [ ]  Wheezing[ ]   SOB [ ]   Snoring [ ]   GI: Nausea [ ]  Vomiting[ ]  Dysphagia[ ]  Heartburn[ ]  Abdominal pain [ ]  Constipation [ ] ; Diarrhea [ ] ; BRBPR [ ]  Melena[ ]  GU: Hematuria[ ]  Dysuria [ ]  Nocturia[ ]  Urgency [ ]   Hesitancy [ ]  Discharge [ ]  Neuro: Headaches[ ]  Vertigo[ ]  Paresthesias[ ]  Spasm [ ]  Speech changes [ ]  Incoordination [ ]   Ortho: Arthritis [ ]  Joint pain [ ]  Muscle pain [ ]  Joint swelling [ ]  Back Pain [ ]  Skin:  Rash [ ]   Pruritis [ ]  Change in skin lesion [ ]   Psych: Depression[ ]  Anxiety[ ]  Confusion [ ]  Memory loss [ ]   Heme/Lypmh: Bleeding [ ]  Bruising [ ]  Enlarged lymph nodes [ ]   Endocrine: Visual blurring [ ]  Paresthesia [ ]  Polyuria [ ]  Polydypsea [ ]    Heat/cold  intolerance [ ]  Hypoglycemia [ ]   Family history- Review and unchanged Social history- Review and unchanged Physical Exam: BP 128/82  Pulse 80  Temp(Src) 98.1 F (36.7 C)  Resp 16  Ht 5\' 7"  (1.702 m)  Wt 394 lb (178.717 kg)  BMI 61.69 kg/m2 Wt Readings from Last 3 Encounters:  02/08/14 394 lb (178.717 kg)  09/03/13 393 lb (178.264 kg)  08/07/13 408 lb (185.068 kg)   General Appearance: Well nourished, in no apparent distress. Eyes: PERRLA, EOMs, conjunctiva no swelling or erythema Sinuses: No Frontal/maxillary tenderness ENT/Mouth: Ext aud canals clear, TMs without erythema, bulging. No erythema, swelling, or exudate on post pharynx.  Tonsils not swollen or erythematous. Hearing normal.  Neck: Supple, thyroid normal.  Respiratory: Respiratory effort normal, BS equal bilaterally without rales, rhonchi, wheezing or stridor.  Cardio: RRR with no MRGs. Brisk peripheral pulses without edema.  Abdomen: Soft, + BS, obese  Non tender, no guarding, rebound, hernias, masses. Lymphatics: Non tender without lymphadenopathy.  Musculoskeletal: Full ROM, 5/5 strength, normal gait.  Skin: Warm, dry without rashes, lesions, ecchymosis.  Neuro: Cranial nerves intact. Normal muscle tone, no cerebellar symptoms. Sensation intact.  Psych: Awake and oriented X 3, normal affect, Insight and Judgment appropriate.    Vicie Mutters, PA-C 10:52 AM [facility]

## 2014-02-09 LAB — VITAMIN D 25 HYDROXY (VIT D DEFICIENCY, FRACTURES): VIT D 25 HYDROXY: 62 ng/mL (ref 30–89)

## 2014-04-20 ENCOUNTER — Other Ambulatory Visit: Payer: Self-pay | Admitting: Internal Medicine

## 2014-04-20 ENCOUNTER — Telehealth: Payer: Self-pay | Admitting: Family

## 2014-04-20 DIAGNOSIS — J069 Acute upper respiratory infection, unspecified: Secondary | ICD-10-CM

## 2014-04-20 MED ORDER — PROMETHAZINE-DM 6.25-15 MG/5ML PO SYRP: ORAL_SOLUTION | ORAL | Status: DC

## 2014-04-20 MED ORDER — BENZONATATE 200 MG PO CAPS: ORAL_CAPSULE | ORAL | Status: DC

## 2014-04-20 MED ORDER — GUAIFENESIN-DM 100-10 MG/5ML PO SYRP: 5.0000 mL | ORAL_SOLUTION | ORAL | Status: DC | PRN

## 2014-04-20 MED ORDER — AZITHROMYCIN 250 MG PO TABS: ORAL_TABLET | ORAL | Status: DC

## 2014-04-20 NOTE — Progress Notes (Signed)
We are sorry that you are not feeling well.  Here is how we plan to help!  Based on what you have shared with me it looks like you have upper respiratory tract inflammation that has resulted in a signification cough.  Inflammation and infection in the upper respiratory tract is commonly called bronchitis and has four common causes:  Allergies, Viral Infections, Acid Reflux and Bacterial Infections.  Allergies, viruses and acid reflux are treated by controlling symptoms or eliminating the cause. An example might be a cough caused by taking certain blood pressure medications. You stop the cough by changing the medication. Another example might be a cough caused by acid reflux. Controlling the reflux helps control the cough.  Based on your presentation I believe you most likely have A cough due to bacteria.  When patients have a fever and a productive cough with a change in color or increased sputum production, we are concerned about bacterial bronchitis.  If left untreated it can progress to pneumonia.  If your symptoms do not improve with your treatment plan it is important that you contact your provider.   I have prescribed Azithromyin 250 mg: two tables now and then one tablet daily for 4 additonal days I have also prescribed Guaifenesin-dextromethorphan 100-10mg /80ml cough syrup. You may take 77ml every 4 hours as needed for cough.        HOME CARE . Only take medications as instructed by your medical team. . Complete the entire course of an antibiotic. . Drink plenty of fluids and get plenty of rest. . Avoid close contacts especially the very young and the elderly . Cover your mouth if you cough or cough into your sleeve. . Always remember to wash your hands . A steam or ultrasonic humidifier can help congestion.    GET HELP RIGHT AWAY IF: . You develop worsening fever. . You become short of breath . You cough up blood. . Your symptoms persist after you have completed your treatment plan MAKE  SURE YOU   Understand these instructions.  Will watch your condition.  Will get help right away if you are not doing well or get worse.  Your e-visit answers were reviewed by a board certified advanced clinical practitioner to complete your personal care plan.  Depending on the condition, your plan could have included both over the counter or prescription medications.  If there is a problem please reply  once you have received a response from your provider.  Your safety is important to [place].  If you have drug allergies check your prescription carefully.    You can use MyChart to ask questions about today's visit, request a non-urgent call back, or ask for a work or school excuse.  You will get an e-mail in the next two days asking about your experience.  I hope that your e-visit has been valuable and will speed your recovery. Thank you for using e-visits.

## 2014-04-24 ENCOUNTER — Ambulatory Visit (INDEPENDENT_AMBULATORY_CARE_PROVIDER_SITE_OTHER): Payer: 59 | Admitting: Physician Assistant

## 2014-04-24 ENCOUNTER — Ambulatory Visit (HOSPITAL_COMMUNITY)
Admission: RE | Admit: 2014-04-24 | Discharge: 2014-04-24 | Disposition: A | Discharge status: Home / Self Care | Payer: 59 | Source: Ambulatory Visit | Attending: Physician Assistant | Admitting: Physician Assistant

## 2014-04-24 ENCOUNTER — Telehealth: Payer: Self-pay | Admitting: Internal Medicine

## 2014-04-24 ENCOUNTER — Encounter: Payer: Self-pay | Admitting: Physician Assistant

## 2014-04-24 VITALS — BP 128/88 | HR 96 | Temp 99.8°F | Resp 18 | Ht 67.0 in | Wt 399.0 lb

## 2014-04-24 DIAGNOSIS — R0989 Other specified symptoms and signs involving the circulatory and respiratory systems: Secondary | ICD-10-CM | POA: Diagnosis not present

## 2014-04-24 DIAGNOSIS — R05 Cough: Secondary | ICD-10-CM | POA: Insufficient documentation

## 2014-04-24 DIAGNOSIS — R059 Cough, unspecified: Secondary | ICD-10-CM

## 2014-04-24 DIAGNOSIS — J209 Acute bronchitis, unspecified: Secondary | ICD-10-CM

## 2014-04-24 MED ORDER — ALBUTEROL SULFATE HFA 108 (90 BASE) MCG/ACT IN AERS: 2.0000 | INHALATION_SPRAY | RESPIRATORY_TRACT | Status: DC | PRN

## 2014-04-24 MED ORDER — PREDNISONE 20 MG PO TABS: ORAL_TABLET | ORAL | Status: AC

## 2014-04-24 MED ORDER — PROMETHAZINE-CODEINE 6.25-10 MG/5ML PO SYRP: 5.0000 mL | ORAL_SOLUTION | Freq: Four times a day (QID) | ORAL | Status: DC | PRN

## 2014-04-24 MED ORDER — DOXYCYCLINE HYCLATE 100 MG PO TABS: 100.0000 mg | ORAL_TABLET | Freq: Two times a day (BID) | ORAL | Status: DC

## 2014-04-24 MED ORDER — IPRATROPIUM-ALBUTEROL 0.5-2.5 (3) MG/3ML IN SOLN
3.0000 mL | Freq: Once | RESPIRATORY_TRACT | Status: AC
  Administered 2014-04-24: 3 mL via RESPIRATORY_TRACT

## 2014-04-24 NOTE — Telephone Encounter (Signed)
Error

## 2014-04-24 NOTE — Progress Notes (Addendum)
Subjective:    Patient ID: Kathryn Chan, female    DOB: [birth year], 29 y.o.   MRN: [id number]  Cough This is a new problem. Episode onset: 04/20/14. The problem has been unchanged. Cough characteristics: Was productive with some yellow. Associated symptoms include ear pain, a fever, headaches, postnasal drip, rhinorrhea, a sore throat, shortness of breath and wheezing. Pertinent negatives include no chills or rash. The symptoms are aggravated by lying down. Risk factors: whole family is sick at home. Treatments tried: Patient was seen by doctor using electronic visit and was given Z-Pak, robitussin and tessalon perles on 04/20/14.  Patient was not improving with cough so Dr. Melford Aase sent in Promethazine-DM and she states that helped a little.  Wheezing  Episode onset: started 04/20/14. The problem occurs constantly. The problem has been unchanged. Associated symptoms include coughing, ear pain, a fever, headaches, rhinorrhea, shortness of breath and a sore throat. Pertinent negatives include no chills or rash. The symptoms are aggravated by lying flat.  Fever  Maximum temperature: did not check temp at home. Associated symptoms include congestion, coughing, ear pain, headaches, a sore throat and wheezing. Pertinent negatives include no rash.   GFR= >89 on 02/08/14 Review of Systems  Constitutional: Positive for fever and diaphoresis. Negative for chills and fatigue.  HENT: Positive for congestion, ear pain, postnasal drip, rhinorrhea, sore throat and voice change. Negative for ear discharge, sinus pressure and trouble swallowing.   Eyes: Negative.   Respiratory: Positive for cough, chest tightness, shortness of breath and wheezing.   Cardiovascular: Negative.   Gastrointestinal: Negative.   Genitourinary: Negative.   Musculoskeletal: Negative.   Skin: Negative.  Negative for rash.  Neurological: Positive for headaches. Negative for dizziness and light-headedness.  Psychiatric/Behavioral: Negative.     Past Medical History  Diagnosis Date  . Prediabetes   . Vitamin D deficiency   . Hypertension   . Obesity, morbid, BMI 50 or higher   . Hypomagnesemia    Current Outpatient Prescriptions on File Prior to Visit  Medication Sig Dispense Refill  . azithromycin (ZITHROMAX) 250 MG tablet Take 2 tablets now and then 1 tablet daily x 4 days. 6 tablet 0  . benzonatate (TESSALON) 200 MG capsule Take 1 perle 3 x day to prevent cough 30 capsule 0  . cholecalciferol (VITAMIN D) 1000 UNITS tablet Take 1,000 Units by mouth daily.    Marland Kitchen levothyroxine (SYNTHROID, LEVOTHROID) 50 MCG tablet TAKE ONE-HALF TABLET BY MOUTH ONCE DAILY 90 tablet 1  . lisinopril-hydrochlorothiazide (PRINZIDE,ZESTORETIC) 20-25 MG per tablet TAKE ONE TABLET BY MOUTH ONCE DAILY 90 tablet 1  . Multiple Vitamin (MULTIVITAMIN) capsule Take 1 capsule by mouth daily.    . phentermine (ADIPEX-P) 37.5 MG tablet Take 1 tablet (37.5 mg total) by mouth daily before breakfast. 30 tablet 2  . promethazine-dextromethorphan (PROMETHAZINE-DM) 6.25-15 MG/5ML syrup Take 1 to 2 tsp every 4 hours as needed for severe cough 360 mL 0   No current facility-administered medications on file prior to visit.   Allergies  Allergen Reactions  . Phentermine     Hyperkalemia     BP 128/88 mmHg  Pulse 96  Temp(Src) 99.8 F (37.7 C) (Temporal)  Resp 18  Ht 5\' 7"  (1.702 m)  Wt 399 lb (180.985 kg)  BMI 62.48 kg/m2  SpO2 98%  LMP 04/10/2014 Wt Readings from Last 3 Encounters:  04/24/14 399 lb (180.985 kg)  02/08/14 394 lb (178.717 kg)  09/03/13 393 lb (178.264 kg)   Objective:   Physical Exam  Constitutional: She is oriented to person, place, and time. She appears well-developed and well-nourished. She has a sickly appearance. No distress.  Morbidly Obese.  HENT:  Head: Normocephalic.  Right Ear: External ear and ear canal normal. Tympanic membrane is bulging. Tympanic membrane is not injected and not erythematous.  Left Ear: External ear and  ear canal normal. Tympanic membrane is injected and retracted. Tympanic membrane is not erythematous and not bulging.  Nose: Nose normal. Right sinus exhibits no maxillary sinus tenderness and no frontal sinus tenderness. Left sinus exhibits no maxillary sinus tenderness and no frontal sinus tenderness.  Mouth/Throat: Uvula is midline and mucous membranes are normal. Mucous membranes are not pale and not dry. No trismus in the jaw. No uvula swelling. Posterior oropharyngeal erythema present. No oropharyngeal exudate, posterior oropharyngeal edema or tonsillar abscesses.  Right TM bulging with clear fluid and non-erythematous and non-edematous.  Left TM retracted and injected, but non-erythematous and non-edematous.  Eyes: Conjunctivae, EOM and lids are normal. Pupils are equal, round, and reactive to light. Right eye exhibits no discharge. Left eye exhibits no discharge. No scleral icterus.  Neck: Trachea normal and normal range of motion. Neck supple. No tracheal tenderness present. No tracheal deviation present.  Raspy voice  Cardiovascular: Normal rate, regular rhythm, S1 normal, S2 normal, normal heart sounds and normal pulses.  Exam reveals no gallop, no distant heart sounds and no friction rub.   No murmur heard. Pulmonary/Chest: Effort normal. No accessory muscle usage or stridor. No respiratory distress. She has decreased breath sounds. She has wheezes. She has no rhonchi. She has no rales. She exhibits no tenderness.  Wheezing and decreased breath sounds in all lung fields.  Abdominal: Soft. Bowel sounds are normal.  Lymphadenopathy:  No tenderness or LAD.  Neurological: She is alert and oriented to person, place, and time. No cranial nerve deficit. Gait normal.  Skin: Skin is warm, dry and intact. No rash noted. She is not diaphoretic. No pallor.  Psychiatric: She has a normal mood and affect. Her speech is normal and behavior is normal. Judgment and thought content normal. Cognition and  memory are normal.  Vitals reviewed.  Assessment & Plan:  1. Acute bronchitis, unspecified organism -Take Prednisone as prescribed for inflammation- predniSONE (DELTASONE) 20 MG tablet; Take 3 tablets PO QDaily for 3 days, then take 2 tablets PO QDaily for 3 days, then take 1 tablet PO QDaily for 3 days  Dispense: 18 tablet; Refill: 0 -Take Promethazine with Codeine as prescribed for cough- promethazine-codeine (PHENERGAN WITH CODEINE) 6.25-10 MG/5ML syrup; Take 5 mLs by mouth every 6 (six) hours as needed for cough. Max: 10mL per day  Dispense: 240 mL; Refill: 0 -Take Doxycycline as prescribed with food- doxycycline (VIBRA-TABS) 100 MG tablet; Take 1 tablet (100 mg total) by mouth 2 (two) times daily. For 7 days with food  Dispense: 14 tablet; Refill: 0 - Take Albuterol as prescribed for wheezing- albuterol (VENTOLIN HFA) 108 (90 BASE) MCG/ACT inhaler; Inhale 2 puffs into the lungs every 4 (four) hours as needed for wheezing or shortness of breath.  Dispense: 1 Inhaler; Refill: 1 -Gave DuoNeb in office and patient tolerated without complications- ipratropium-albuterol (DUONEB) 0.5-2.5 (3) MG/3ML nebulizer solution 3 mL; Take 3 mLs by nebulization once.  2. Cough Ordered chest x-ray to R/O pneumonia. - DG Chest 2 View; Future  Discussed medication effects and SE's.  Pt agreed to treatment plan. If you are not feeling better in 10-14 days, then please call the office. Please keep  your follow up appt on 05/17/14.  Addendum: Chest x-ray showed: "IMPRESSION: No active cardiopulmonary disease."  Please continue medications as prescribed.  If you are not feeling better in 10-14 days, then please call the office.   Shuntel Fishburn, Stephani Police, PA-C 8:36 AM [facility]

## 2014-04-24 NOTE — Patient Instructions (Addendum)
-Take Doxycycline as prescribed with food. -Take Prednisone as prescribed for inflammation -Take Promethazine-Codeine as prescribed for cough -Take Albuterol inhaler for wheezing. -Please go get chest x-ray at [facility] hospital   If you are not feeling better in 10-14 days, then please call the office. If your chest x-ray comes back negative, then please come back in 1 week.  Please make sure you are drinking plenty of water to stay hydrated.  Acute Bronchitis Bronchitis is inflammation of the airways that extend from the windpipe into the lungs (bronchi). The inflammation often causes mucus to develop. This leads to a cough, which is the most common symptom of bronchitis.  In acute bronchitis, the condition usually develops suddenly and goes away over time, usually in a couple weeks. Smoking, allergies, and asthma can make bronchitis worse. Repeated episodes of bronchitis may cause further lung problems.  CAUSES Acute bronchitis is most often caused by the same virus that causes a cold. The virus can spread from person to person (contagious) through coughing, sneezing, and touching contaminated objects. SIGNS AND SYMPTOMS   Cough.   Fever.   Coughing up mucus.   Body aches.   Chest congestion.   Chills.   Shortness of breath.   Sore throat.  DIAGNOSIS  Acute bronchitis is usually diagnosed through a physical exam. Your health care provider will also ask you questions about your medical history. Tests, such as chest X-rays, are sometimes done to rule out other conditions.  TREATMENT  Acute bronchitis usually goes away in a couple weeks. Oftentimes, no medical treatment is necessary. Medicines are sometimes given for relief of fever or cough. Antibiotic medicines are usually not needed but may be prescribed in certain situations. In some cases, an inhaler may be recommended to help reduce shortness of breath and control the cough. A cool mist vaporizer may also be used to  help thin bronchial secretions and make it easier to clear the chest.  HOME CARE INSTRUCTIONS  Get plenty of rest.   Drink enough fluids to keep your urine clear or pale yellow (unless you have a medical condition that requires fluid restriction). Increasing fluids may help thin your respiratory secretions (sputum) and reduce chest congestion, and it will prevent dehydration.   Take medicines only as directed by your health care provider.  If you were prescribed an antibiotic medicine, finish it all even if you start to feel better.  Avoid smoking and secondhand smoke. Exposure to cigarette smoke or irritating chemicals will make bronchitis worse. If you are a smoker, consider using nicotine gum or skin patches to help control withdrawal symptoms. Quitting smoking will help your lungs heal faster.   Reduce the chances of another bout of acute bronchitis by washing your hands frequently, avoiding people with cold symptoms, and trying not to touch your hands to your mouth, nose, or eyes.   Keep all follow-up visits as directed by your health care provider.  SEEK MEDICAL CARE IF: Your symptoms do not improve after 1 week of treatment.  SEEK IMMEDIATE MEDICAL CARE IF:  You develop an increased fever or chills.   You have chest pain.   You have severe shortness of breath.  You have bloody sputum.   You develop dehydration.  You faint or repeatedly feel like you are going to pass out.  You develop repeated vomiting.  You develop a severe headache. MAKE SURE YOU:   Understand these instructions.  Will watch your condition.  Will get help right away if you  are not doing well or get worse. Document Released: 05/13/2004 Document Revised: 08/20/2013 Document Reviewed: 09/26/2012 [facility] Patient Information 2015 [place], [place]. This information is not intended to replace advice given to you by your health care provider. Make sure you discuss any questions you have with your  health care provider.

## 2014-05-17 ENCOUNTER — Ambulatory Visit: Payer: Self-pay | Admitting: Physician Assistant

## 2014-06-26 ENCOUNTER — Ambulatory Visit: Payer: Self-pay | Admitting: Internal Medicine

## 2014-08-27 ENCOUNTER — Other Ambulatory Visit: Payer: Self-pay | Admitting: Physician Assistant

## 2014-09-23 ENCOUNTER — Other Ambulatory Visit: Payer: Self-pay | Admitting: Physician Assistant

## 2014-09-23 ENCOUNTER — Encounter: Payer: Self-pay | Admitting: Physician Assistant

## 2014-09-23 MED ORDER — LISINOPRIL-HYDROCHLOROTHIAZIDE 20-25 MG PO TABS: ORAL_TABLET | ORAL | Status: DC

## 2014-11-18 ENCOUNTER — Ambulatory Visit: Payer: Self-pay | Admitting: Physician Assistant

## 2014-12-17 ENCOUNTER — Encounter: Payer: Self-pay | Admitting: Physician Assistant

## 2014-12-17 ENCOUNTER — Other Ambulatory Visit: Payer: Self-pay | Admitting: Physician Assistant

## 2014-12-17 MED ORDER — LISINOPRIL-HYDROCHLOROTHIAZIDE 20-25 MG PO TABS: ORAL_TABLET | ORAL | Status: DC

## 2014-12-17 MED ORDER — LEVOTHYROXINE SODIUM 50 MCG PO TABS: ORAL_TABLET | ORAL | Status: DC

## 2014-12-19 ENCOUNTER — Ambulatory Visit: Payer: Self-pay | Admitting: Physician Assistant

## 2015-03-28 ENCOUNTER — Ambulatory Visit (INDEPENDENT_AMBULATORY_CARE_PROVIDER_SITE_OTHER): Payer: Commercial Managed Care - HMO | Admitting: Physician Assistant

## 2015-03-28 ENCOUNTER — Encounter: Payer: Self-pay | Admitting: Physician Assistant

## 2015-03-28 VITALS — BP 124/62 | HR 84 | Temp 97.9°F | Resp 16 | Ht 67.0 in | Wt >= 6400 oz

## 2015-03-28 DIAGNOSIS — R7303 Prediabetes: Secondary | ICD-10-CM | POA: Diagnosis not present

## 2015-03-28 DIAGNOSIS — E785 Hyperlipidemia, unspecified: Secondary | ICD-10-CM

## 2015-03-28 DIAGNOSIS — E559 Vitamin D deficiency, unspecified: Secondary | ICD-10-CM

## 2015-03-28 DIAGNOSIS — D649 Anemia, unspecified: Secondary | ICD-10-CM

## 2015-03-28 DIAGNOSIS — I10 Essential (primary) hypertension: Secondary | ICD-10-CM | POA: Diagnosis not present

## 2015-03-28 LAB — HEMOGLOBIN A1C
Hgb A1c MFr Bld: 5.8 % — ABNORMAL HIGH (ref <5.7)
MEAN PLASMA GLUCOSE: 120 mg/dL — AB (ref <117)

## 2015-03-28 LAB — CBC WITH DIFFERENTIAL/PLATELET
Basophils Absolute: 0.1 10*3/uL (ref 0.0–0.1)
Basophils Relative: 1 % (ref 0–1)
EOS ABS: 0.2 10*3/uL (ref 0.0–0.7)
Eosinophils Relative: 2 % (ref 0–5)
HCT: 44.2 % (ref 36.0–46.0)
Hemoglobin: 14.5 g/dL (ref 12.0–15.0)
Lymphocytes Relative: 27 % (ref 12–46)
Lymphs Abs: 2.8 10*3/uL (ref 0.7–4.0)
MCH: 30.1 pg (ref 26.0–34.0)
MCHC: 32.8 g/dL (ref 30.0–36.0)
MCV: 91.7 fL (ref 78.0–100.0)
MPV: 9.9 fL (ref 8.6–12.4)
Monocytes Absolute: 0.5 10*3/uL (ref 0.1–1.0)
Monocytes Relative: 5 % (ref 3–12)
Neutro Abs: 6.7 10*3/uL (ref 1.7–7.7)
Neutrophils Relative %: 65 % (ref 43–77)
PLATELETS: 346 10*3/uL (ref 150–400)
RBC: 4.82 MIL/uL (ref 3.87–5.11)
RDW: 13.1 % (ref 11.5–15.5)
WBC: 10.3 10*3/uL (ref 4.0–10.5)

## 2015-03-28 MED ORDER — LISINOPRIL-HYDROCHLOROTHIAZIDE 20-25 MG PO TABS: ORAL_TABLET | ORAL | Status: DC

## 2015-03-28 MED ORDER — LEVOTHYROXINE SODIUM 50 MCG PO TABS: ORAL_TABLET | ORAL | Status: DC

## 2015-03-28 MED ORDER — PHENTERMINE HCL 37.5 MG PO TABS: 37.5000 mg | ORAL_TABLET | Freq: Every day | ORAL | Status: DC

## 2015-03-28 NOTE — Patient Instructions (Signed)
Phentermine  While taking the medication we may ask that you come into the office once a month or once every 2-3 months to monitor your weight, blood pressure, and heart rate. In addition we can help answer your questions about diet, exercise, and help you every step of the way with your weight loss journey. Sometime it is helpful if you bring in a food diary or use an app on your phone such as myfitnesspal to record your calorie intake, especially in the beginning.   You can start out on 1/3 to 1/2 a pill in the morning and if you are tolerating it well you can increase to one pill daily. I also have some patients that take 1/3 or 1/2 at lunch to help prevent night time eating.  This medication is cheapest CASH pay at [facility] is 14-17 dollars and you do NOT need a membership to get meds from there.    What is this medicine? PHENTERMINE (FEN ter meen) decreases your appetite. This medicine is intended to be used in addition to a healthy reduced calorie diet and exercise. The best results are achieved this way. This medicine is only indicated for short-term use. Eventually your weight loss may level out and the medication will no longer be needed.   How should I use this medicine? Take this medicine by mouth. Follow the directions on the prescription label. The tablets should stay in the bottle until immediately before you take your dose. Take your doses at regular intervals. Do not take your medicine more often than directed.  Overdosage: If you think you have taken too much of this medicine contact a poison control center or emergency room at once. NOTE: This medicine is only for you. Do not share this medicine with others.  What if I miss a dose? If you miss a dose, take it as soon as you can. If it is almost time for your next dose, take only that dose. Do not take double or extra doses. Do not increase or in any way change your dose without consulting your  doctor.  What should I watch for while using this medicine? Notify your physician immediately if you become short of breath while doing your normal activities. Do not take this medicine within 6 hours of bedtime. It can keep you from getting to sleep. Avoid drinks that contain caffeine and try to stick to a regular bedtime every night. Do not stand or sit up quickly, especially if you are an older patient. This reduces the risk of dizzy or fainting spells. Avoid alcoholic drinks.  What side effects may I notice from receiving this medicine? Side effects that you should report to your doctor or health care professional as soon as possible: -chest pain, palpitations -depression or severe changes in mood -increased blood pressure -irritability -nervousness or restlessness -severe dizziness -shortness of breath -problems urinating -unusual swelling of the legs -vomiting  Side effects that usually do not require medical attention (report to your doctor or health care professional if they continue or are bothersome): -blurred vision or other eye problems -changes in sexual ability or desire -constipation or diarrhea -difficulty sleeping -dry mouth or unpleasant taste -headache -nausea This list may not describe all possible side effects. Call your doctor for medical advice about side effects. You may report side effects to FDA at 1-800-FDA-1088.   We want weight loss that will last so you should lose 1-2 pounds a week.  THAT IS IT!  Please pick THREE things a month to change. Once it is a habit check off the item. Then pick another three items off the list to become habits.  If you are already doing a habit on the list GREAT!  Cross that item off! o Don't drink your calories. Ie, alcohol, soda, fruit juice, and sweet tea.  o Drink more water. Drink a glass when you feel hungry or before each meal.  o Eat breakfast - Complex carb and protein (likeDannon light and fit yogurt, oatmeal, fruit,  eggs, [place] bacon). o Measure your cereal.  Eat no more than one cup a day. (ie [place]) o Eat an apple a day. o Add a vegetable a day. o Try a new vegetable a month. o Use Pam! Stop using oil or butter to cook. o Don't finish your plate or use smaller plates. o Share your dessert. o Eat sugar free Jello for dessert or frozen grapes. o Don't eat 2-3 hours before bed. o Switch to whole wheat bread, pasta, and brown rice. o Make healthier choices when you eat out. No fries! o Pick baked chicken, NOT fried. o Don't forget to SLOW DOWN when you eat. It is not going anywhere.  o Take the stairs. o Park far away in the parking lot o [employer] (or weights) for 10 minutes while watching TV. o Walk at work for 10 minutes during break. o Walk outside 1 time a week with your friend, kids, dog, or significant other. o Start a walking group at [place] the mall as much as you can tolerate.  o Keep a food diary. o Weigh yourself daily. o Walk for 15 minutes 3 days per week. o Cook at home more often and eat out less.  If life happens and you go back to old habits, it is okay.  Just start over. You can do it!   If you experience chest pain, get short of breath, or tired during the exercise, please stop immediately and inform your doctor.   Before you even begin to attack a weight-loss plan, it pays to remember this: You are not fat. You have fat. Losing weight isn't about blame or shame; it's simply another achievement to accomplish. Dieting is like any other skill-you have to buckle down and work at it. As long as you act in a smart, reasonable way, you'll ultimately get where you want to be. Here are some weight loss pearls for you.  1. It's Not a Diet. It's a Lifestyle Thinking of a diet as something you're on and suffering through only for the short term doesn't work. To shed weight and keep it off, you need to make permanent changes to the way you eat. It's OK to indulge occasionally,  of course, but if you cut calories temporarily and then revert to your old way of eating, you'll gain back the weight quicker than you can say yo-yo. Use it to lose it. Research shows that one of the best predictors of long-term weight loss is how many pounds you drop in the first month. For that reason, nutritionists often suggest being stricter for the first two weeks of your new eating strategy to build momentum. Cut out added sugar and alcohol and avoid unrefined carbs. After that, figure out how you can reincorporate them in a way that's healthy and maintainable.  2. There's a Right Way to Exercise Working out burns calories and fat and boosts your metabolism by building muscle. But those  trying to lose weight are notorious for overestimating the number of calories they burn and underestimating the amount they take in. Unfortunately, your system is biologically programmed to hold on to extra pounds and that means when you start exercising, your body senses the deficit and ramps up its hunger signals. If you're not diligent, you'll eat everything you burn and then some. Use it to lose it. Cardio gets all the exercise glory, but strength and interval training are the real heroes. They help you build lean muscle, which in turn increases your metabolism and calorie-burning ability 3. Don't Overreact to Mild Hunger Some people have a hard time losing weight because of hunger anxiety. To them, being hungry is bad-something to be avoided at all costs-so they carry snacks with them and eat when they don't need to. Others eat because they're stressed out or bored. While you never want to get to the point of being ravenous (that's when bingeing is likely to happen), a hunger pang, a craving, or the fact that it's 3:00 p.m. should not send you racing for the vending machine or obsessing about the energy bar in your purse. Ideally, you should put off eating until your stomach is growling and it's difficult to  concentrate.  Use it to lose it. When you feel the urge to eat, use the HALT method. Ask yourself, Am I really hungry? Or am I angry or anxious, lonely or bored, or tired? If you're still not certain, try the apple test. If you're truly hungry, an apple should seem delicious; if it doesn't, something else is going on. Or you can try drinking water and making yourself busy, if you are still hungry try a healthy snack.  4. Not All Calories Are Created Equal The mechanics of weight loss are pretty simple: Take in fewer calories than you use for energy. But the kind of food you eat makes all the difference. Processed food that's high in saturated fat and refined starch or sugar can cause inflammation that disrupts the hormone signals that tell your brain you're full. The result: You eat a lot more.  Use it to lose it. Clean up your diet. Swap in whole, unprocessed foods, including vegetables, lean protein, and healthy fats that will fill you up and give you the biggest nutritional bang for your calorie buck. In a few weeks, as your brain starts receiving regular hunger and fullness signals once again, you'll notice that you feel less hungry overall and naturally start cutting back on the amount you eat.  5. Protein, Produce, and Plant-Based Fats Are Your Weight-Loss Trinity Here's why eating the three Ps regularly will help you drop pounds. Protein fills you up. You need it to build lean muscle, which keeps your metabolism humming so that you can torch more fat. People in a weight-loss program who ate double the recommended daily allowance for protein (about 110 grams for a 150-pound woman) lost 70 percent of their weight from fat, while people who ate the RDA lost only about 40 percent, one study found. Produce is packed with filling fiber. "It's very difficult to consume too many calories if you're eating a lot of vegetables. Example: Three cups of broccoli is a lot of food, yet only 93 calories. (Fruit is  another story. It can be easy to overeat and can contain a lot of calories from sugar, so be sure to monitor your intake.) Plant-based fats like olive oil and those in avocados and nuts are healthy and extra  satiating.  Use it to lose it. Aim to incorporate each of the three Ps into every meal and snack. People who eat protein throughout the day are able to keep weight off, according to a study in the [place] of Clinical Nutrition. In addition to meat, poultry and seafood, good sources are beans, lentils, eggs, tofu, and yogurt. As for fat, keep portion sizes in check by measuring out salad dressing, oil, and nut butters (shoot for one to two tablespoons). Finally, eat veggies or a little fruit at every meal. People who did that consumed 308 fewer calories but didn't feel any hungrier than when they didn't eat more produce.  7. How You Eat Is As Important As What You Eat In order for your brain to register that you're full, you need to focus on what you're eating. Sit down whenever you eat, preferably at a table. Turn off the TV or computer, put down your phone, and look at your food. Smell it. Chew slowly, and don't put another bite on your fork until you swallow. When women ate lunch this attentively, they consumed 30 percent less when snacking later than those who listened to an audiobook at lunchtime, according to a study in the [place] of Nutrition. 8. Weighing Yourself Really Works The scale provides the best evidence about whether your efforts are paying off. Seeing the numbers tick up or down or stagnate is motivation to keep going-or to rethink your approach. A 2015 study at [facility] found that daily weigh-ins helped people lose more weight, keep it off, and maintain that loss, even after two years. Use it to lose it. Step on the scale at the same time every day for the best results. If your weight shoots up several pounds from one weigh-in to the next, don't freak out.  Eating a lot of salt the night before or having your period is the likely culprit. The number should return to normal in a day or two. It's a steady climb that you need to do something about. 9. Too Much Stress and Too Little Sleep Are Your Enemies When you're tired and frazzled, your body cranks up the production of cortisol, the stress hormone that can cause carb cravings. Not getting enough sleep also boosts your levels of ghrelin, a hormone associated with hunger, while suppressing leptin, a hormone that signals fullness and satiety. People on a diet who slept only five and a half hours a night for two weeks lost 55 percent less fat and were hungrier than those who slept eight and a half hours, according to a study in the [place]. Use it to lose it. Prioritize sleep, aiming for seven hours or more a night, which research shows helps lower stress. And make sure you're getting quality zzz's. If a snoring spouse or a fidgety cat wakes you up frequently throughout the night, you may end up getting the equivalent of just four hours of sleep, according to a study from [facility]. Keep pets out of the bedroom, and use a white-noise app to drown out snoring. 10. You Will Hit a plateau-And You Can Bust Through It As you slim down, your body releases much less leptin, the fullness hormone.  If you're not strength training, start right now. Building muscle can raise your metabolism to help you overcome a plateau. To keep your body challenged and burning calories, incorporate new moves and more intense intervals into your workouts or add another sweat session to  your weekly routine. Alternatively, cut an extra 100 calories or so a day from your diet. Now that you've lost weight, your body simply doesn't need as much fuel.   Ways to cut 100 calories  1. Eat your eggs with hot sauce OR salsa instead of cheese.  Eggs are great for breakfast, but many people consider eggs and  cheese to be BFFs. Instead of cheese-1 oz. of cheddar has 114 calories-top your eggs with hot sauce, which contains no calories and helps with satiety and metabolism. Salsa is also a great option!!  2. Top your toast, waffles or pancakes with mashed berries instead of jelly or syrup. Half a cup of berries-fresh, frozen or thawed-has about 40 calories, compared with 2 tbsp. of maple syrup or jelly, which both have about 100 calories. The berries will also give you a good punch of fiber, which helps keep you full and satisfied and won't spike blood sugar quickly like the jelly or syrup. 3. Swap the non-fat latte for black coffee with a splash of half-and-half. Contrary to its name, that non-fat latte has 130 calories and a startling 19g of carbohydrates per 16 oz. serving. Replacing that 'light' drinkable dessert with a black coffee with a splash of half-and-half saves you more than 100 calories per 16 oz. serving. 4. Sprinkle salads with freeze-dried raspberries instead of dried cranberries. If you want a sweet addition to your nutritious salad, stay away from dried cranberries. They have a whopping 130 calories per  cup and 30g carbohydrates. Instead, sprinkle freeze-dried raspberries guilt-free and save more than 100 calories per  cup serving, adding 3g of belly-filling fiber. 5. Go for mustard in place of mayo on your sandwich. Mustard can add really nice flavor to any sandwich, and there are tons of varieties, from spicy to honey. A serving of mayo is 95 calories, versus 10 calories in a serving of mustard. 6. Choose a DIY salad dressing instead of the store-bought kind. Mix Dijon or whole grain mustard with low-fat Kefir or red wine vinegar and garlic. 7. Use hummus as a spread instead of a dip. Use hummus as a spread on a high-fiber cracker or tortilla with a sandwich and save on calories without sacrificing taste. 8. Pick just one salad "accessory." Salad isn't automatically a calorie winner.  It's easy to over-accessorize with toppings. Instead of topping your salad with nuts, avocado and cranberries (all three will clock in at 313 calories), just pick one. The next day, choose a different accessory, which will also keep your salad interesting. You don't wear all your jewelry every day, right? 9. Ditch the white pasta in favor of spaghetti squash. One cup of cooked spaghetti squash has about 40 calories, compared with traditional spaghetti, which comes with more than 200. Spaghetti squash is also nutrient-dense. It's a good source of fiber and Vitamins A and C, and it can be eaten just like you would eat pasta-with a great tomato sauce and [place] meatballs or with pesto, tofu and spinach, for example. 10. Dress up your chili, soups and stews with non-fat [place] yogurt instead of sour cream. Just a 'dollop' of sour cream can set you back 115 calories and a whopping 12g of fat-seven of which are of the artery-clogging variety. Added bonus: [place] yogurt is packed with muscle-building protein, calcium and B Vitamins. 11. Mash cauliflower instead of mashed potatoes. One cup of traditional mashed potatoes-in all their creamy goodness-has more than 200 calories, compared to mashed cauliflower, which you can  typically eat for less than 100 calories per 1 cup serving. Cauliflower is a great source of the antioxidant indole-3-carbinol (I3C), which may help reduce the risk of some cancers, like breast cancer. 12. Ditch the ice cream sundae in favor of a [place] yogurt parfait. Instead of a cup of ice cream or fro-yo for dessert, try 1 cup of nonfat Greek yogurt topped with fresh berries and a sprinkle of cacao nibs. Both toppings are packed with antioxidants, which can help reduce cellular inflammation and oxidative damage. And the comparison is a no-brainer: One cup of ice cream has about 275 calories; one cup of frozen yogurt has about 230; and a cup of Greek yogurt has just 130, plus twice the protein, so  you're less likely to return to the freezer for a second helping. 13. Put olive oil in a spray container instead of using it directly from the bottle. Each tablespoon of olive oil is 120 calories and 15g of fat. Use a mister instead of pouring it straight into the pan or onto a salad. This allows for portion control and will save you more than 100 calories. 14. When baking, substitute canned pumpkin for butter or oil. Canned pumpkin-not pumpkin pie mix-is loaded with Vitamin A, which is important for skin and eye health, as well as immunity. And the comparisons are pretty crazy:  cup of canned pumpkin has about 40 calories, compared to butter or oil, which has more than 800 calories. Yes, 800 calories. Applesauce and mashed banana can also serve as good substitutions for butter or oil, usually in a 1:1 ratio. 15. Top casseroles with high-fiber cereal instead of breadcrumbs. Breadcrumbs are typically made with white bread, while breakfast cereals contain 5-9g of fiber per serving. Not only will you save more than 150 calories per  cup serving, the swap will also keep you more full and you'll get a metabolism boost from the added fiber. 16. Snack on pistachios instead of macadamia nuts. Believe it or not, you get the same amount of calories from 35 pistachios (100 calories) as you would from only five macadamia nuts. 17. Chow down on kale chips rather than potato chips. This is my favorite 'don't knock it 'till you try it' swap. Kale chips are so easy to make at home, and you can spice them up with a little grated parmesan or chili powder. Plus, they're a mere fraction of the calories of potato chips, but with the same crunch factor we crave so often. 18. Add seltzer and some fruit slices to your cocktail instead of soda or fruit juice. One cup of soda or fruit juice can pack on as much as 140 calories. Instead, use seltzer and fruit slices. The fruit provides valuable phytochemicals, such as flavonoids  and anthocyanins, which help to combat cancer and stave off the aging process.

## 2015-03-28 NOTE — Progress Notes (Signed)
Assessment and Plan:  Hypertension: Continue medication, monitor blood pressure at home. Continue DASH diet. Cholesterol: Continue diet and exercise. Check cholesterol.  Pre-diabetes-Continue diet and exercise. Check A1C Vitamin D Def- check level and continue medications.  Hypothyroidism-check TSH level, continue medications the same.  Fatigue- check labs Obesity with co morbidities- long discussion about weight loss, diet, and exercise  Will start the patient on phentermine- hand out given  Follow up in 1 month with food diary   Continue diet and meds as discussed. Further disposition pending results of labs  HPI 29 y.o. female  presents for long over due follow up with hypertension, hyperlipidemia, prediabetes and vitamin D. 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 She sleeps well at night. States she does not feel sad, she has some SOB with exertion, worse with a hill. No orhopnea, no PND, no CP.  Her blood pressure has been controlled at home, today [name] is BP: 124/62 mmHg She does not workout at this time. She denies chest pain, shortness of breath, dizziness.  She is not on cholesterol medication and denies myalgias. Her cholesterol is at goal. The cholesterol last visit was:   Lab Results  Component Value Date   CHOL 124 02/08/2014   HDL 47 02/08/2014   LDLCALC 59 02/08/2014   TRIG 88 02/08/2014   CHOLHDL 2.6 02/08/2014   She has been working on diet and exercise for prediabetes, and denies paresthesia of the feet, polydipsia, polyuria and visual disturbances. Last A1C in the office was:  Lab Results  Component Value Date   HGBA1C 5.7* 02/08/2014   Patient is on Vitamin D supplement.   Lab Results  Component Value Date   VD25OH 62 02/08/2014   BMI is Body mass index is 64.36 kg/(m^2)., she is working on diet and exercise. Wt Readings from Last 3 Encounters:  03/28/15 411 lb (186.428 kg)  04/24/14 399 lb  (180.985 kg)  02/08/14 394 lb (178.717 kg)   She is on thyroid medication, 1/2 of the pill daily. Her medication was not changed last visit. Patient denies heat / cold intolerance, nervousness and palpitations.  Lab Results  Component Value Date   TSH 1.474 02/08/2014    Current Medications:  Current Outpatient Prescriptions on File Prior to Visit  Medication Sig Dispense Refill  . cholecalciferol (VITAMIN D) 1000 UNITS tablet Take 1,000 Units by mouth daily.    Marland Kitchen levothyroxine (SYNTHROID, LEVOTHROID) 50 MCG tablet TAKE ONE-HALF TABLET BY MOUTH ONCE DAILY 90 tablet 0  . lisinopril-hydrochlorothiazide (PRINZIDE,ZESTORETIC) 20-25 MG per tablet TAKE ONE TABLET BY MOUTH ONCE DAILY 90 tablet 0  . Multiple Vitamin (MULTIVITAMIN) capsule Take 1 capsule by mouth daily.    . phentermine (ADIPEX-P) 37.5 MG tablet Take 1 tablet (37.5 mg total) by mouth daily before breakfast. (Patient not taking: Reported on 03/28/2015) 30 tablet 2   No current facility-administered medications on file prior to visit.   Medical History:  Past Medical History  Diagnosis Date  . Prediabetes   . Vitamin D deficiency   . Hypertension   . Obesity, morbid, BMI 50 or higher ([place])   . Hypomagnesemia    Allergies:  Allergies  Allergen Reactions  . Phentermine     Hyperkalemia    Review of Systems  Constitutional: Positive for malaise/fatigue. Negative for fever, chills, weight loss and diaphoresis.  HENT: Negative.   Eyes: Negative.   Respiratory: Negative.   Cardiovascular: Negative.   Gastrointestinal:  Negative.   Genitourinary: Negative.   Musculoskeletal: Negative.   Skin: Negative.   Neurological: Negative.  Negative for weakness.  Endo/Heme/Allergies: Negative.   Psychiatric/Behavioral: Negative.      Family history- Review and unchanged Social history- Review and unchanged Physical Exam: BP 124/62 mmHg  Pulse 84  Temp(Src) 97.9 F (36.6 C) (Temporal)  Resp 16  Ht 5\' 7"  (1.702 m)  Wt 411  lb (186.428 kg)  BMI 64.36 kg/m2  SpO2 98%  LMP 03/17/2015 Wt Readings from Last 3 Encounters:  03/28/15 411 lb (186.428 kg)  04/24/14 399 lb (180.985 kg)  02/08/14 394 lb (178.717 kg)   General Appearance: Well nourished, in no apparent distress. Eyes: PERRLA, EOMs, conjunctiva no swelling or erythema Sinuses: No Frontal/maxillary tenderness ENT/Mouth: Ext aud canals clear, TMs without erythema, bulging. No erythema, swelling, or exudate on post pharynx.  Tonsils not swollen or erythematous. Hearing normal.  Neck: Supple, thyroid normal.  Respiratory: Respiratory effort normal, BS equal bilaterally without rales, rhonchi, wheezing or stridor.  Cardio: RRR with no MRGs. Brisk peripheral pulses without edema.  Abdomen: Soft, + BS, morbidly obese, Non tender, no guarding, rebound, hernias, masses. Lymphatics: Non tender without lymphadenopathy.  Musculoskeletal: Full ROM, 5/5 strength, normal gait.  Skin: Warm, dry without rashes, lesions, ecchymosis.  Neuro: Cranial nerves intact. Normal muscle tone, no cerebellar symptoms. Sensation intact.  Psych: Awake and oriented X 3, normal affect, Insight and Judgment appropriate.    Vicie Mutters 11:53 AM

## 2015-03-29 LAB — HEPATIC FUNCTION PANEL
ALBUMIN: 4.1 g/dL (ref 3.6–5.1)
ALT: 38 U/L — ABNORMAL HIGH (ref 6–29)
AST: 31 U/L — AB (ref 10–30)
Alkaline Phosphatase: 50 U/L (ref 33–115)
BILIRUBIN TOTAL: 0.5 mg/dL (ref 0.2–1.2)
Bilirubin, Direct: 0.1 mg/dL (ref <=0.2)
Indirect Bilirubin: 0.4 mg/dL (ref 0.2–1.2)
TOTAL PROTEIN: 7.3 g/dL (ref 6.1–8.1)

## 2015-03-29 LAB — TSH: TSH: 1.516 u[IU]/mL (ref 0.350–4.500)

## 2015-03-29 LAB — BASIC METABOLIC PANEL WITH GFR
BUN: 12 mg/dL (ref 7–25)
CHLORIDE: 97 mmol/L — AB (ref 98–110)
CO2: 31 mmol/L (ref 20–31)
Calcium: 9.7 mg/dL (ref 8.6–10.2)
Creat: 0.63 mg/dL (ref 0.50–1.10)
GFR, Est African American: >89 mL/min (ref >=60)
GFR, Est Non African American: >89 mL/min (ref >=60)
GLUCOSE: 92 mg/dL (ref 65–99)
Potassium: 4.3 mmol/L (ref 3.5–5.3)
Sodium: 137 mmol/L (ref 135–146)

## 2015-03-29 LAB — INSULIN, FASTING: Insulin fasting, serum: 30.6 u[IU]/mL — ABNORMAL HIGH (ref 2.0–19.6)

## 2015-03-29 LAB — LIPID PANEL
CHOL/HDL RATIO: 2.6 ratio (ref <=5.0)
Cholesterol: 121 mg/dL — ABNORMAL LOW (ref 125–200)
HDL: 47 mg/dL (ref >=46)
LDL Cholesterol: 57 mg/dL (ref <130)
Triglycerides: 85 mg/dL (ref <150)
VLDL: 17 mg/dL (ref <30)

## 2015-03-29 LAB — MAGNESIUM: MAGNESIUM: 1.6 mg/dL (ref 1.5–2.5)

## 2015-03-29 LAB — VITAMIN D 25 HYDROXY (VIT D DEFICIENCY, FRACTURES): Vit D, 25-Hydroxy: 32 ng/mL (ref 30–100)

## 2015-03-29 LAB — IRON AND TIBC
%SAT: 24 % (ref 11–50)
IRON: 85 ug/dL (ref 40–190)
TIBC: 355 ug/dL (ref 250–450)
UIBC: 270 ug/dL (ref 125–400)

## 2015-03-29 LAB — VITAMIN B12: VITAMIN B 12: 667 pg/mL (ref 211–911)

## 2015-03-29 LAB — FERRITIN: Ferritin: 155 ng/mL (ref 10–291)

## 2015-06-16 ENCOUNTER — Ambulatory Visit: Payer: Self-pay | Admitting: Physician Assistant

## 2015-06-28 ENCOUNTER — Other Ambulatory Visit: Payer: Self-pay | Admitting: Internal Medicine

## 2015-06-28 ENCOUNTER — Other Ambulatory Visit: Payer: Self-pay | Admitting: Physician Assistant

## 2015-06-28 DIAGNOSIS — I10 Essential (primary) hypertension: Secondary | ICD-10-CM

## 2015-06-28 MED ORDER — LISINOPRIL-HYDROCHLOROTHIAZIDE 20-25 MG PO TABS: ORAL_TABLET | ORAL | Status: DC

## 2015-07-07 ENCOUNTER — Ambulatory Visit: Payer: Self-pay | Admitting: Physician Assistant

## 2015-07-26 ENCOUNTER — Telehealth: Payer: 59 | Admitting: Physician Assistant

## 2015-07-26 DIAGNOSIS — B9789 Other viral agents as the cause of diseases classified elsewhere: Principal | ICD-10-CM

## 2015-07-26 DIAGNOSIS — J069 Acute upper respiratory infection, unspecified: Secondary | ICD-10-CM

## 2015-07-26 MED ORDER — BENZONATATE 100 MG PO CAPS: 100.0000 mg | ORAL_CAPSULE | Freq: Three times a day (TID) | ORAL | Status: DC | PRN

## 2015-07-26 NOTE — Progress Notes (Signed)
We are sorry that you are not feeling well.  Here is how we plan to help!  Based on what you have shared with me it looks like you have upper respiratory tract inflammation that has resulted in a significant cough.  Inflammation and infection in the upper respiratory tract is commonly called bronchitis and has four common causes:  Allergies, Viral Infections, Acid Reflux and Bacterial Infections.  Allergies, viruses and acid reflux are treated by controlling symptoms or eliminating the cause. An example might be a cough caused by taking certain blood pressure medications. You stop the cough by changing the medication. Another example might be a cough caused by acid reflux. Controlling the reflux helps control the cough.  Based on your presentation I believe you most likely have A cough due to a virus.  This is called viral bronchitis and is best treated by rest, plenty of fluids and control of the cough.  You may use Ibuprofen or Tylenol as directed to help your symptoms.    In addition you may use A prescription cough medication called Tessalon Perles 100mg. You may take 1-2 capsules every 8 hours as needed for your cough.    HOME CARE . Only take medications as instructed by your medical team. . Complete the entire course of an antibiotic. . Drink plenty of fluids and get plenty of rest. . Avoid close contacts especially the very young and the elderly . Cover your mouth if you cough or cough into your sleeve. . Always remember to wash your hands . A steam or ultrasonic humidifier can help congestion.    GET HELP RIGHT AWAY IF: . You develop worsening fever. . You become short of breath . You cough up blood. . Your symptoms persist after you have completed your treatment plan MAKE SURE YOU   Understand these instructions.  Will watch your condition.  Will get help right away if you are not doing well or get worse.  Your e-visit answers were reviewed by a board certified advanced  clinical practitioner to complete your personal care plan.  Depending on the condition, your plan could have included both over the counter or prescription medications. If there is a problem please reply  once you have received a response from your provider. Your safety is important to us.  If you have drug allergies check your prescription carefully.    You can use MyChart to ask questions about today's visit, request a non-urgent call back, or ask for a work or school excuse for 24 hours related to this e-Visit. If it has been greater than 24 hours you will need to follow up with your provider, or enter a new e-Visit to address those concerns. You will get an e-mail in the next two days asking about your experience.  I hope that your e-visit has been valuable and will speed your recovery. Thank you for using e-visits.   

## 2015-08-15 ENCOUNTER — Ambulatory Visit: Payer: Self-pay | Admitting: Physician Assistant

## 2015-10-02 ENCOUNTER — Ambulatory Visit (INDEPENDENT_AMBULATORY_CARE_PROVIDER_SITE_OTHER): Payer: Commercial Managed Care - HMO | Admitting: Internal Medicine

## 2015-10-02 ENCOUNTER — Encounter: Payer: Self-pay | Admitting: Internal Medicine

## 2015-10-02 VITALS — BP 134/76 | HR 108 | Temp 98.2°F | Resp 20 | Ht 67.0 in

## 2015-10-02 DIAGNOSIS — I10 Essential (primary) hypertension: Secondary | ICD-10-CM

## 2015-10-02 MED ORDER — LISINOPRIL-HYDROCHLOROTHIAZIDE 20-25 MG PO TABS: ORAL_TABLET | ORAL | Status: DC

## 2015-10-02 MED ORDER — LEVOTHYROXINE SODIUM 50 MCG PO TABS: ORAL_TABLET | ORAL | Status: DC

## 2015-10-02 NOTE — Progress Notes (Signed)
Assessment and Plan:  Hypertension:  -Continue medication -monitor blood pressure at home. -Continue DASH diet -Reminder to go to the ER if any CP, SOB, nausea, dizziness, severe HA, changes vision/speech, left arm numbness and tingling and jaw pain.  Cholesterol - Continue diet and exercise -Check cholesterol.   PreDiabetes without complications -Continue diet and exercise.  -Check A1C  Vitamin D Def -check level -continue medications.   Morbid obesity -patient unable to tolerate phentermine -she wants to try lifestyle changes instead  Continue diet and meds as discussed. Further disposition pending results of labs. Discussed med's effects and SE's.    HPI 30 y.o. female  presents for 3 month follow up with hypertension, hyperlipidemia, diabetes and vitamin D deficiency.   Her blood pressure has been controlled at home, today [name] is BP: 134/76 mmHg.She does not workout. She denies chest pain, shortness of breath, dizziness.   She is on cholesterol medication and denies myalgias. Her cholesterol is at goal. The cholesterol was:  03/28/2015: Cholesterol 121*; HDL 47; LDL Cholesterol 57; Triglycerides 85   She has been working on diet and exercise for diabetes without complications, she is not on bASA, she is on ACE/ARB, and denies  foot ulcerations, hyperglycemia, hypoglycemia , increased appetite, nausea, paresthesia of the feet, polydipsia, polyuria, visual disturbances, vomiting and weight loss. Last A1C was: 03/28/2015: Hgb A1c MFr Bld 5.8*   Patient is on Vitamin D supplement. 03/28/2015: Vit D, 25-Hydroxy 32     Current Medications:  Current Outpatient Prescriptions on File Prior to Visit  Medication Sig Dispense Refill  . cholecalciferol (VITAMIN D) 1000 UNITS tablet Take 1,000 Units by mouth daily.    Marland Kitchen levothyroxine (SYNTHROID, LEVOTHROID) 50 MCG tablet TAKE ONE-HALF TABLET BY MOUTH ONCE DAILY 90 tablet 0  . lisinopril-hydrochlorothiazide (PRINZIDE,ZESTORETIC)  20-25 MG tablet TAKE ONE TABLET BY MOUTH ONCE DAILY 90 tablet 0  . Multiple Vitamin (MULTIVITAMIN) capsule Take 1 capsule by mouth daily.     No current facility-administered medications on file prior to visit.   Medical History:  Past Medical History  Diagnosis Date  . Prediabetes   . Vitamin D deficiency   . Hypertension   . Obesity, morbid, BMI 50 or higher ([place])   . Hypomagnesemia    Allergies:  Allergies  Allergen Reactions  . Phentermine     Hyperkalemia     Review of Systems:  Review of Systems  Constitutional: Negative for fever, weight loss, malaise/fatigue and diaphoresis.  HENT: Negative for congestion, ear discharge and sore throat.   Eyes: Negative.   Respiratory: Negative for cough, shortness of breath and wheezing.   Cardiovascular: Negative for chest pain, palpitations and leg swelling.  Gastrointestinal: Negative for heartburn, abdominal pain, diarrhea, constipation, blood in stool and melena.  Genitourinary: Negative.   Skin: Negative.   Neurological: Negative for dizziness, sensory change, loss of consciousness and headaches.  Psychiatric/Behavioral: Negative for depression. The patient is not nervous/anxious and does not have insomnia.     Family history- Review and unchanged  Social history- Review and unchanged  Physical Exam: BP 134/76 mmHg  Pulse 108  Temp(Src) 98.2 F (36.8 C) (Temporal)  Resp 20  Ht 5\' 7"  (1.702 m)  LMP 10/02/2015 Wt Readings from Last 3 Encounters:  03/28/15 411 lb (186.428 kg)  04/24/14 399 lb (180.985 kg)  02/08/14 394 lb (178.717 kg)   General Appearance: Well nourished well developed, non-toxic appearing, in no apparent distress. Eyes: PERRLA, EOMs, conjunctiva no swelling or erythema ENT/Mouth: Ear canals  clear with no erythema, swelling, or discharge.  TMs normal bilaterally, oropharynx clear, moist, with no exudate.   Neck: Supple, thyroid normal, no JVD, no cervical adenopathy.  Respiratory: Respiratory effort  normal, breath sounds clear A&P, no wheeze, rhonchi or rales noted.  No retractions, no accessory muscle usage Cardio: RRR with no MRGs. No noted edema.  Abdomen: Soft, + BS.  Non tender, no guarding, rebound, hernias, masses. Musculoskeletal: Full ROM, 5/5 strength, Normal gait Skin: Warm, dry without rashes, lesions, ecchymosis.  Neuro: Awake and oriented X 3, Cranial nerves intact. No cerebellar symptoms.  Psych: normal affect, Insight and Judgment appropriate.    Starlyn Skeans, PA-C 2:55 PM [facility]

## 2016-01-31 IMAGING — DX DG CHEST 2V
2 series · 2 of 2 positions shown · non-contrast
Comparison: None.

CLINICAL DATA: Cough and congestion

EXAM:
CHEST  2 VIEW

[chest pa]
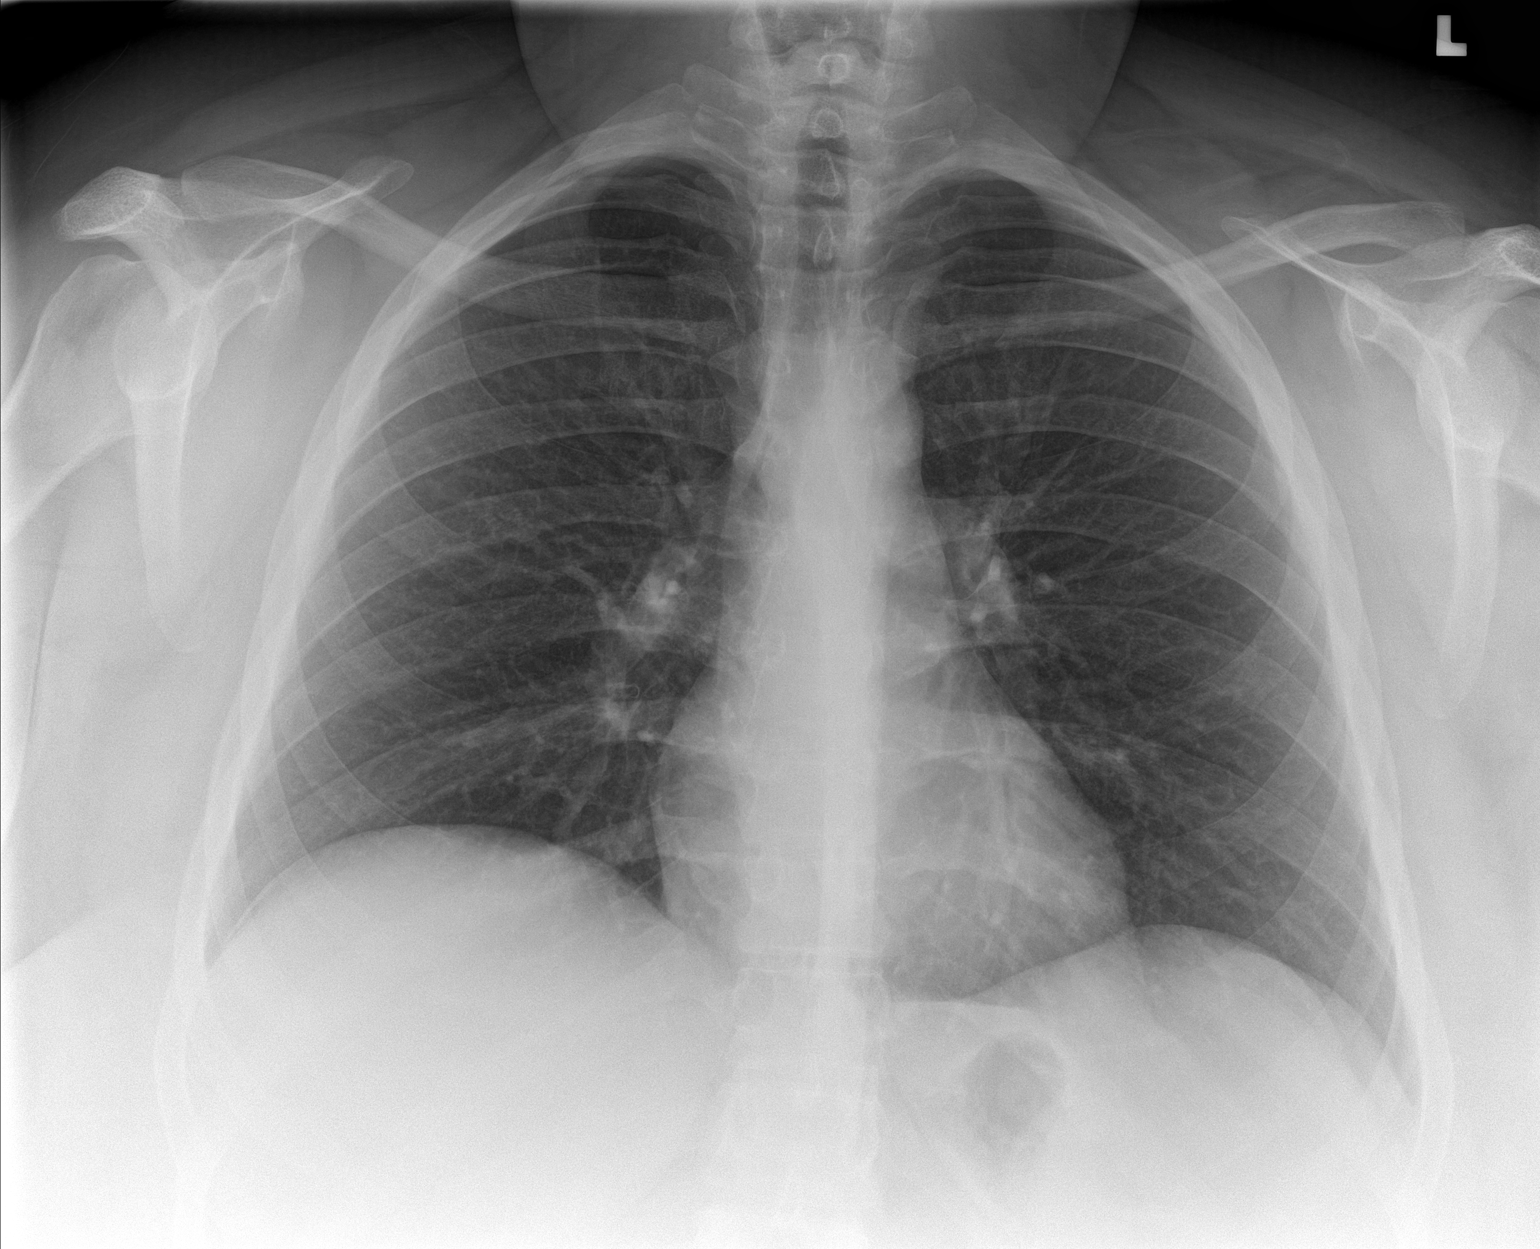

[chest lat]
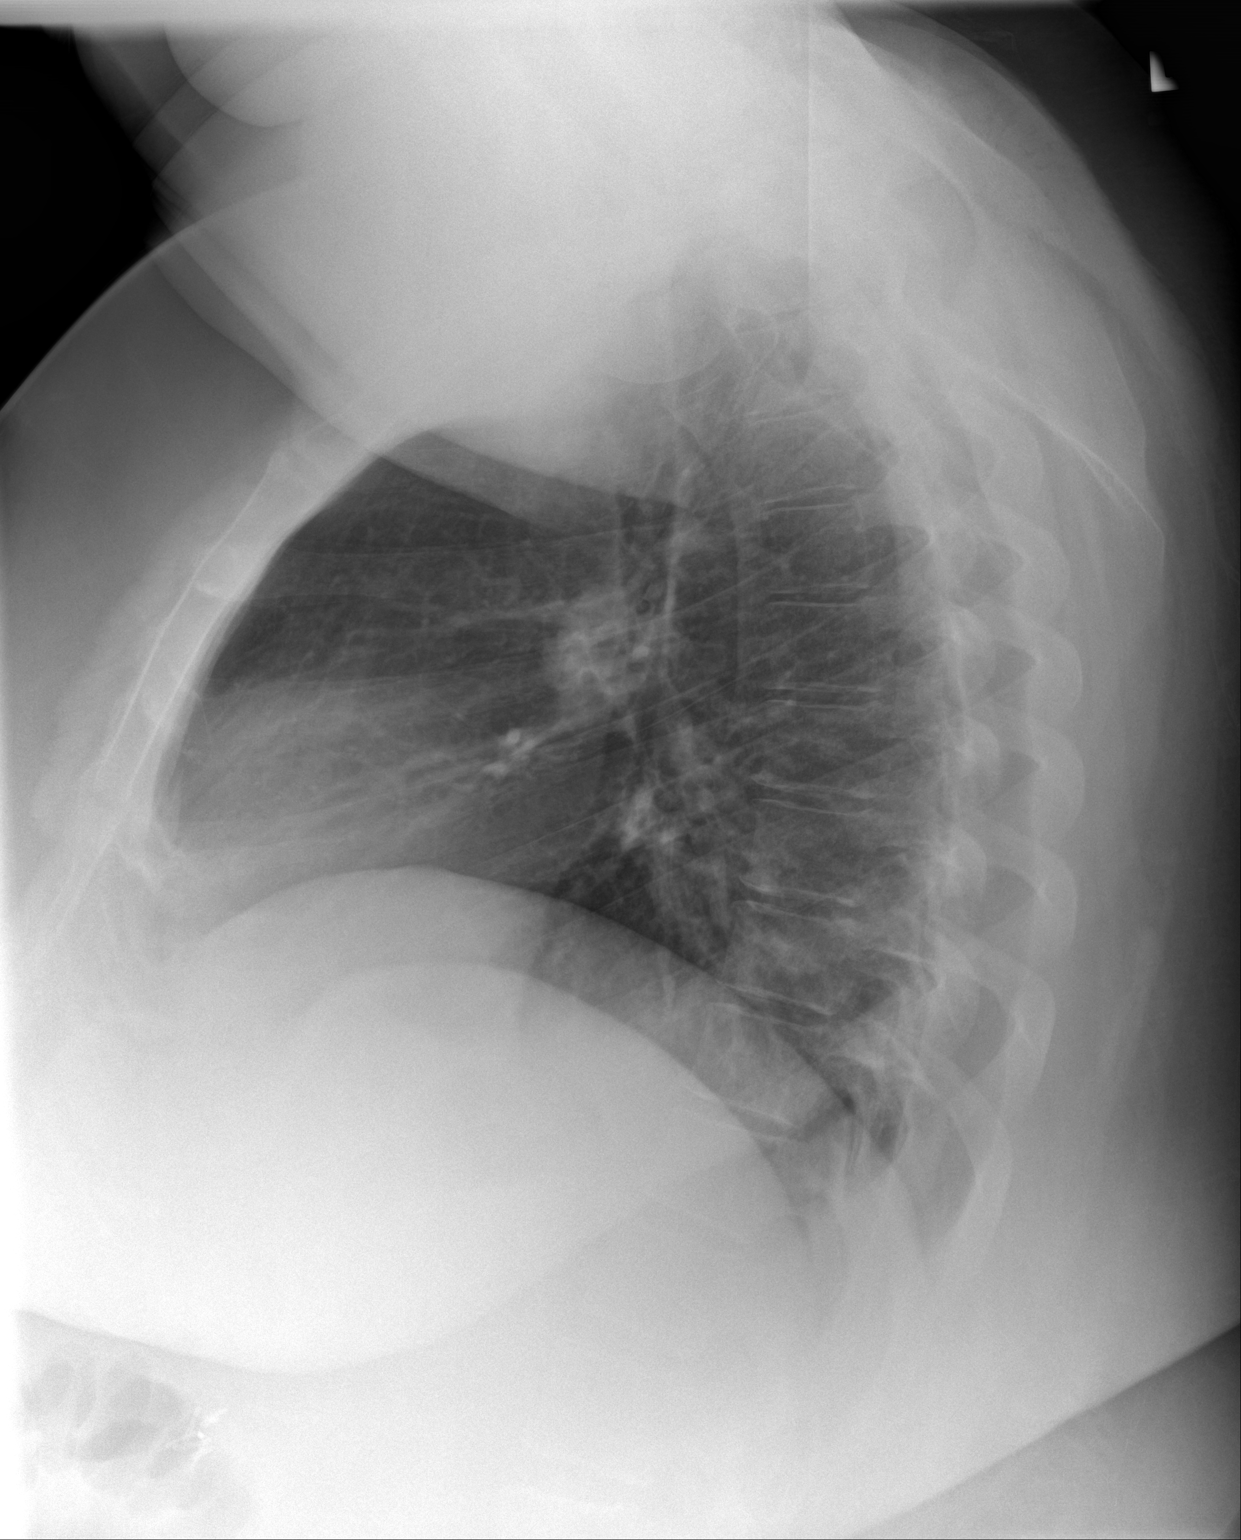

[2 of 2 positions shown; findings below may reference images not displayed]

FINDINGS: The heart size and mediastinal contours are within normal limits.
Both lungs are clear. The visualized skeletal structures are
unremarkable.
IMPRESSION: No active cardiopulmonary disease.

## 2016-04-02 ENCOUNTER — Ambulatory Visit: Payer: Self-pay | Admitting: Internal Medicine

## 2016-06-16 ENCOUNTER — Ambulatory Visit: Payer: Self-pay | Admitting: Internal Medicine

## 2016-06-25 DIAGNOSIS — L821 Other seborrheic keratosis: Secondary | ICD-10-CM | POA: Diagnosis not present

## 2016-06-25 DIAGNOSIS — L918 Other hypertrophic disorders of the skin: Secondary | ICD-10-CM | POA: Diagnosis not present

## 2016-06-25 DIAGNOSIS — D224 Melanocytic nevi of scalp and neck: Secondary | ICD-10-CM | POA: Diagnosis not present

## 2016-06-30 ENCOUNTER — Other Ambulatory Visit: Payer: Self-pay | Admitting: *Deleted

## 2016-06-30 DIAGNOSIS — I10 Essential (primary) hypertension: Secondary | ICD-10-CM

## 2016-06-30 MED ORDER — LISINOPRIL-HYDROCHLOROTHIAZIDE 20-25 MG PO TABS: ORAL_TABLET | ORAL | 0 refills | Status: DC

## 2016-07-05 ENCOUNTER — Ambulatory Visit: Payer: Self-pay | Admitting: Internal Medicine

## 2016-07-07 ENCOUNTER — Ambulatory Visit: Payer: Self-pay | Admitting: Internal Medicine

## 2016-07-21 ENCOUNTER — Ambulatory Visit (INDEPENDENT_AMBULATORY_CARE_PROVIDER_SITE_OTHER): Payer: Commercial Managed Care - HMO | Admitting: Internal Medicine

## 2016-07-21 ENCOUNTER — Encounter: Payer: Self-pay | Admitting: Internal Medicine

## 2016-07-21 VITALS — BP 128/76 | HR 90 | Temp 98.2°F | Resp 20 | Ht 67.0 in

## 2016-07-21 DIAGNOSIS — E782 Mixed hyperlipidemia: Secondary | ICD-10-CM

## 2016-07-21 DIAGNOSIS — E039 Hypothyroidism, unspecified: Secondary | ICD-10-CM | POA: Diagnosis not present

## 2016-07-21 DIAGNOSIS — R7303 Prediabetes: Secondary | ICD-10-CM

## 2016-07-21 DIAGNOSIS — Z79899 Other long term (current) drug therapy: Secondary | ICD-10-CM | POA: Diagnosis not present

## 2016-07-21 LAB — CBC WITH DIFFERENTIAL/PLATELET
BASOS ABS: 108 {cells}/uL (ref 0–200)
Basophils Relative: 1 %
EOS ABS: 216 {cells}/uL (ref 15–500)
Eosinophils Relative: 2 %
HEMATOCRIT: 42.2 % (ref 35.0–45.0)
HEMOGLOBIN: 13.4 g/dL (ref 11.7–15.5)
LYMPHS ABS: 2916 {cells}/uL (ref 850–3900)
Lymphocytes Relative: 27 %
MCH: 28.9 pg (ref 27.0–33.0)
MCHC: 31.8 g/dL — AB (ref 32.0–36.0)
MCV: 91.1 fL (ref 80.0–100.0)
MONO ABS: 540 {cells}/uL (ref 200–950)
MPV: 10.2 fL (ref 7.5–12.5)
Monocytes Relative: 5 %
NEUTROS PCT: 65 %
Neutro Abs: 7020 cells/uL (ref 1500–7800)
Platelets: 353 10*3/uL (ref 140–400)
RBC: 4.63 MIL/uL (ref 3.80–5.10)
RDW: 13.9 % (ref 11.0–15.0)
WBC: 10.8 10*3/uL (ref 3.8–10.8)

## 2016-07-21 LAB — HEPATIC FUNCTION PANEL
ALK PHOS: 50 U/L (ref 33–115)
ALT: 35 U/L — AB (ref 6–29)
AST: 28 U/L (ref 10–30)
Albumin: 4 g/dL (ref 3.6–5.1)
BILIRUBIN INDIRECT: 0.3 mg/dL (ref 0.2–1.2)
Bilirubin, Direct: 0.1 mg/dL (ref <=0.2)
TOTAL PROTEIN: 6.9 g/dL (ref 6.1–8.1)
Total Bilirubin: 0.4 mg/dL (ref 0.2–1.2)

## 2016-07-21 LAB — BASIC METABOLIC PANEL WITH GFR
BUN: 13 mg/dL (ref 7–25)
CALCIUM: 9.3 mg/dL (ref 8.6–10.2)
CHLORIDE: 100 mmol/L (ref 98–110)
CO2: 22 mmol/L (ref 20–31)
Creat: 0.66 mg/dL (ref 0.50–1.10)
GFR, Est African American: >89 mL/min (ref >=60)
GLUCOSE: 94 mg/dL (ref 65–99)
Potassium: 4.3 mmol/L (ref 3.5–5.3)
Sodium: 139 mmol/L (ref 135–146)

## 2016-07-21 LAB — LIPID PANEL
Cholesterol: 112 mg/dL (ref <200)
HDL: 40 mg/dL — ABNORMAL LOW (ref >50)
LDL CALC: 52 mg/dL (ref <100)
Total CHOL/HDL Ratio: 2.8 Ratio (ref <5.0)
Triglycerides: 100 mg/dL (ref <150)
VLDL: 20 mg/dL (ref <30)

## 2016-07-21 LAB — TSH: TSH: 1.6 m[IU]/L

## 2016-07-21 MED ORDER — NALTREXONE-BUPROPION HCL ER 8-90 MG PO TB12: ORAL_TABLET | ORAL | 3 refills | Status: DC

## 2016-07-21 MED ORDER — CLOTRIMAZOLE-BETAMETHASONE 1-0.05 % EX CREA: TOPICAL_CREAM | CUTANEOUS | 1 refills | Status: DC

## 2016-07-21 NOTE — Progress Notes (Signed)
Assessment and Plan:   1. Mixed hyperlipidemia -cont diet and exercise - Lipid panel  2. Prediabetes -cont diet and exercise - Hemoglobin A1c  3. Hypothyroidism, unspecified type -cont levothyroxine -dose adjust if necessary - TSH  4. Morbid obesity ([place]) -if contrave not covered will send in 150 mg of wellbutrin -recommended getting a personal trainer for accountability -will send to nutrition for education on healthy diet -may eventually need to have help from [place] - Ambulatory referral to diabetic education - Naltrexone-Bupropion HCl ER 8-90 MG TB12; Take 2 tablets twice daily with meals.  Dispense: 360 tablet; Refill: 3  5. Medication management  - CBC with Differential/Platelet - BASIC METABOLIC PANEL WITH GFR - Hepatic function panel       HPI 31 y.o.female presents for 6 month follow-up of her morbid obesity.  She reports that she is having a hard time with her diet.  She reports that her weight is increasing a lot and she is having a hard time with her joints and also with her breathing.  She is gaining weight again.  She reports that she is not sure what her weight is.  She reports that she can't find scales that can weight her very often.    Her blood pressure has been doing well.  She is taking her medictions.  She does not check it at home regularly.   She is thirsty a lot.  She reports that she is concerned that this means she has diabetes.    She is not exercising.  She is wanting to try contrave as she could not tolerate the phentermine.    She is taking her thyroid medication first thing in the morning on an empty stomach.    Past Medical History:  Diagnosis Date  . Hypertension   . Hypomagnesemia   . Obesity, morbid, BMI 50 or higher ([place])   . Prediabetes   . Vitamin D deficiency      Allergies  Allergen Reactions  . Phentermine     Hyperkalemia      Current Outpatient Prescriptions on File Prior to Visit  Medication Sig Dispense  Refill  . cholecalciferol (VITAMIN D) 1000 UNITS tablet Take 1,000 Units by mouth daily.    Marland Kitchen levothyroxine (SYNTHROID, LEVOTHROID) 50 MCG tablet TAKE ONE-HALF TABLET BY MOUTH ONCE DAILY 90 tablet 2  . lisinopril-hydrochlorothiazide (PRINZIDE,ZESTORETIC) 20-25 MG tablet TAKE ONE TABLET BY MOUTH ONCE DAILY 30 tablet 0  . Multiple Vitamin (MULTIVITAMIN) capsule Take 1 capsule by mouth daily.     No current facility-administered medications on file prior to visit.     ROS: all negative except above.   Physical Exam: There were no vitals filed for this visit. BP 128/76   Pulse 90   Temp 98.2 F (36.8 C) (Temporal)   Resp 20   Ht 5\' 7"  (1.702 m)   LMP 07/19/2016  General Appearance: Severely obese, Well developed well nourished, non-toxic appearing in no apparent distress. Eyes: PERRLA, EOMs, conjunctiva w/ no swelling or erythema or discharge Sinuses: No Frontal/maxillary tenderness ENT/Mouth: Ear canals clear without swelling or erythema.  TM's normal bilaterally with no retractions, bulging, or loss of landmarks.   Neck: Supple, thyroid normal, no notable JVD  Respiratory: Respiratory effort normal, Clear breath sounds anteriorly and posteriorly bilaterally without rales, rhonchi, wheezing or stridor. No retractions or accessory muscle usage. Cardio: RRR with no MRGs.   Abdomen: Soft, + BS.  Non tender, no guarding, rebound, hernias, masses.  Musculoskeletal: Full ROM, 5/5  strength, normal gait.  Skin: Warm, dry without rashes  Neuro: Awake and oriented X 3, Cranial nerves intact. Normal muscle tone, no cerebellar symptoms. Sensation intact.  Psych: normal affect, Insight and Judgment appropriate.     Starlyn Skeans, PA-C 11:07 AM  [facility]

## 2016-07-22 LAB — HEMOGLOBIN A1C
HEMOGLOBIN A1C: 5.8 % — AB (ref <5.7)
MEAN PLASMA GLUCOSE: 120 mg/dL

## 2016-07-25 ENCOUNTER — Other Ambulatory Visit: Payer: Self-pay | Admitting: Internal Medicine

## 2016-07-25 ENCOUNTER — Encounter: Payer: Self-pay | Admitting: Internal Medicine

## 2016-07-25 DIAGNOSIS — I10 Essential (primary) hypertension: Secondary | ICD-10-CM

## 2016-07-25 MED ORDER — LISINOPRIL-HYDROCHLOROTHIAZIDE 20-25 MG PO TABS: ORAL_TABLET | ORAL | 3 refills | Status: DC

## 2016-07-28 ENCOUNTER — Other Ambulatory Visit: Payer: Self-pay | Admitting: Internal Medicine

## 2016-07-28 MED ORDER — BUPROPION HCL ER (XL) 150 MG PO TB24: 150.0000 mg | ORAL_TABLET | ORAL | 2 refills | Status: DC

## 2016-11-23 NOTE — Progress Notes (Deleted)
Assessment and Plan:     Continue diet and meds as discussed. Further disposition pending results of labs  HPI 31 y.o. female  presents for long over due follow up with hypertension, hyperlipidemia, prediabetes and vitamin D.  Her blood pressure has been controlled at home, today [name] is   She does not workout at this time. She denies chest pain, shortness of breath, dizziness.  She is not on cholesterol medication and denies myalgias. Her cholesterol is at goal. The cholesterol last visit was:   Lab Results  Component Value Date   CHOL 112 07/21/2016   HDL 40 (L) 07/21/2016   LDLCALC 52 07/21/2016   TRIG 100 07/21/2016   CHOLHDL 2.8 07/21/2016   She has been working on diet and exercise for prediabetes, and denies paresthesia of the feet, polydipsia, polyuria and visual disturbances. Last A1C in the office was:  Lab Results  Component Value Date   HGBA1C 5.8 (H) 07/21/2016   Patient is on Vitamin D supplement.   Lab Results  Component Value Date   VD25OH 32 03/28/2015   BMI is There is no height or weight on file to calculate BMI., she is working on diet and exercise. Wt Readings from Last 3 Encounters:  03/28/15 (!) 411 lb (186.4 kg)  04/24/14 (!) 399 lb (181 kg)  02/08/14 (!) 394 lb (178.7 kg)   She is on thyroid medication, 1/2 of the pill daily. Her medication was not changed last visit. Patient denies heat / cold intolerance, nervousness and palpitations.  Lab Results  Component Value Date   TSH 1.60 07/21/2016    Current Medications:  Current Outpatient Prescriptions on File Prior to Visit  Medication Sig Dispense Refill  . buPROPion (WELLBUTRIN XL) 150 MG 24 hr tablet Take 1 tablet (150 mg total) by mouth every morning. 30 tablet 2  . cholecalciferol (VITAMIN D) 1000 UNITS tablet Take 1,000 Units by mouth daily.    . clotrimazole-betamethasone (LOTRISONE) cream Apply to affected area 2 times daily 15 g 1  . levothyroxine (SYNTHROID, LEVOTHROID) 50 MCG  tablet TAKE ONE-HALF TABLET BY MOUTH ONCE DAILY 90 tablet 2  . lisinopril-hydrochlorothiazide (PRINZIDE,ZESTORETIC) 20-25 MG tablet TAKE ONE TABLET BY MOUTH ONCE DAILY 30 tablet 3  . Multiple Vitamin (MULTIVITAMIN) capsule Take 1 capsule by mouth daily.    . Naltrexone-Bupropion HCl ER 8-90 MG TB12 Take 2 tablets twice daily with meals. 360 tablet 3   No current facility-administered medications on file prior to visit.    Medical History:  Past Medical History:  Diagnosis Date  . Hypertension   . Hypomagnesemia   . Obesity, morbid, BMI 50 or higher ([place])   . Prediabetes   . Vitamin D deficiency    Allergies:  Allergies  Allergen Reactions  . Phentermine     Hyperkalemia    Review of Systems  Constitutional: Negative for chills, diaphoresis, fever, malaise/fatigue and weight loss.  HENT: Negative.   Eyes: Negative.   Respiratory: Negative.   Cardiovascular: Negative.   Gastrointestinal: Negative.   Genitourinary: Negative.   Musculoskeletal: Negative.   Skin: Negative.   Neurological: Negative.  Negative for weakness.  Endo/Heme/Allergies: Negative.   Psychiatric/Behavioral: Negative.      Family history- Review and unchanged Social history- Review and unchanged Physical Exam: There were no vitals taken for this visit. Wt Readings from Last 3 Encounters:  03/28/15 (!) 411 lb (186.4 kg)  04/24/14 (!) 399 lb (181 kg)  02/08/14 (!) 394 lb (178.7 kg)  General Appearance: Well nourished, in no apparent distress. Eyes: PERRLA, EOMs, conjunctiva no swelling or erythema Sinuses: No Frontal/maxillary tenderness ENT/Mouth: Ext aud canals clear, TMs without erythema, bulging. No erythema, swelling, or exudate on post pharynx.  Tonsils not swollen or erythematous. Hearing normal.  Neck: Supple, thyroid normal.  Respiratory: Respiratory effort normal, BS equal bilaterally without rales, rhonchi, wheezing or stridor.  Cardio: RRR with no MRGs. Brisk peripheral pulses without  edema.  Abdomen: Soft, + BS, morbidly obese, Non tender, no guarding, rebound, hernias, masses. Lymphatics: Non tender without lymphadenopathy.  Musculoskeletal: Full ROM, 5/5 strength, normal gait.  Skin: Warm, dry without rashes, lesions, ecchymosis.  Neuro: Cranial nerves intact. Normal muscle tone, no cerebellar symptoms. Sensation intact.  Psych: Awake and oriented X 3, normal affect, Insight and Judgment appropriate.    Vicie Mutters 7:39 AM

## 2016-11-24 ENCOUNTER — Ambulatory Visit: Payer: Self-pay | Admitting: Physician Assistant

## 2017-03-02 ENCOUNTER — Ambulatory Visit: Payer: Self-pay | Admitting: Physician Assistant

## 2017-03-24 ENCOUNTER — Other Ambulatory Visit: Payer: Self-pay

## 2017-03-24 DIAGNOSIS — I10 Essential (primary) hypertension: Secondary | ICD-10-CM

## 2017-03-24 MED ORDER — LISINOPRIL-HYDROCHLOROTHIAZIDE 20-25 MG PO TABS: ORAL_TABLET | ORAL | 0 refills | Status: DC

## 2017-03-28 ENCOUNTER — Ambulatory Visit: Payer: Self-pay | Admitting: Physician Assistant

## 2017-04-02 DIAGNOSIS — Z79899 Other long term (current) drug therapy: Secondary | ICD-10-CM | POA: Insufficient documentation

## 2017-04-02 DIAGNOSIS — E039 Hypothyroidism, unspecified: Secondary | ICD-10-CM | POA: Insufficient documentation

## 2017-04-02 NOTE — Progress Notes (Signed)
FOLLOW UP  Assessment and Plan:   Hypertension Well controlled with current medications  Monitor blood pressure at home; patient to call if consistently greater than 130/80 Continue DASH diet.   Reminder to go to the ER if any CP, SOB, nausea, dizziness, severe HA, changes vision/speech, left arm numbness and tingling and jaw pain.  Hypothyroidism continue medications the same; adjust as indicated pending lab results reminded to take on an empty stomach 30-57mins before food.  check TSH level  Prediabetes Discussed disease and risks Discussed diet/exercise, weight management  A1C, insulin level  Morbid obesity with BMI of 60.0-69.9, adult ([place]) Long discussion about weight loss, diet, and exercise Recommended whole foods diet high in fruits/vegetables (50% of meal), bean, whole grains, lean proteins - discussed portion sizes Information provided on common weight loss medications - patient would like to review at home and call back if she wishes to start.  Start walking daily/ attending gym  Discussed ideal weight Will follow up in 4 months - cannot afford more frequent  Vitamin D Def/ osteoporosis prevention Continue supplementation Check Vit D level  Continue diet and meds as discussed. Further disposition pending results of labs. Discussed med's effects and SE's.   Over 30 minutes of exam, counseling, chart review, and critical decision making was performed.   Future Appointments  Date Time Provider [place]  08/10/2017  8:45 AM Liane Comber, NP [facility] None    ----------------------------------------------------------------------------------------------------------------------  HPI 31 y.o. female  presents for 6 month follow up on hypertension, cholesterol, hypothyroid, prediabetes, morbid obesity and vitamin D deficiency. Morbid obesity with BMI of 64.5 at last check is primary significant concern for this patient, and she was prescribed contrave at the  last visit as she has not tolerated phentermine, as well as referred for nutritional counseling. She was advised to hire a Physiological scientist for accountability.   she is prescribed contrave for weight loss but felt uncomfortable taking and never started.   We cannot verify current weight due to equipment limitations.   BMI is Body mass index is 64.37 kg/m., she is making small changes - cutting out soda, bread, avoiding fried foods. She did get a gym membership as advised but has not started going due to lack of social support. She does have a fitbit and has doubled her steps from 1500 to 3000 - encouraged to continue increasing with [place] [id number]. She has been aiming to drink 1 gallon of water daily Wt Readings from Last 3 Encounters:  03/28/15 (!) 411 lb (186.4 kg)  04/24/14 (!) 399 lb (181 kg)  02/08/14 (!) 394 lb (178.7 kg)   Her blood pressure has been controlled at home, today [name] is BP: 136/84   She does not workout. She denies chest pain, shortness of breath, dizziness.    She is not on cholesterol medication and denies myalgias. Her cholesterol is at goal. The cholesterol last visit was:   Lab Results  Component Value Date   CHOL 112 07/21/2016   HDL 40 (L) 07/21/2016   LDLCALC 52 07/21/2016   TRIG 100 07/21/2016   CHOLHDL 2.8 07/21/2016    She has been working on diet  for prediabetes, and denies increased appetite, nausea, paresthesia of the feet, polydipsia, polyuria and visual disturbances. Last A1C in the office was:  Lab Results  Component Value Date   HGBA1C 5.8 (H) 07/21/2016   She is on thyroid medication. Her medication was not changed last visit.   Lab Results  Component Value Date   TSH 1.60 07/21/2016   Patient is on Vitamin D supplement but remained below goal at the last check:    Lab Results  Component Value Date   VD25OH 32 03/28/2015        Current Medications:  Current Outpatient Medications on File Prior to Visit  Medication Sig  .  cholecalciferol (VITAMIN D) 1000 UNITS tablet Take 1,000 Units by mouth daily.  Marland Kitchen lisinopril-hydrochlorothiazide (PRINZIDE,ZESTORETIC) 20-25 MG tablet TAKE ONE TABLET BY MOUTH ONCE DAILY  . Multiple Vitamin (MULTIVITAMIN) capsule Take 1 capsule by mouth daily.  Marland Kitchen buPROPion (WELLBUTRIN XL) 150 MG 24 hr tablet Take 1 tablet (150 mg total) by mouth every morning. (Patient not taking: Reported on 04/04/2017)  . clotrimazole-betamethasone (LOTRISONE) cream Apply to affected area 2 times daily (Patient not taking: Reported on 04/04/2017)  . levothyroxine (SYNTHROID, LEVOTHROID) 50 MCG tablet TAKE ONE-HALF TABLET BY MOUTH ONCE DAILY (Patient not taking: Reported on 04/04/2017)  . Naltrexone-Bupropion HCl ER 8-90 MG TB12 Take 2 tablets twice daily with meals. (Patient not taking: Reported on 04/04/2017)   No current facility-administered medications on file prior to visit.      Allergies:  Allergies  Allergen Reactions  . Phentermine     Hyperkalemia     Medical History:  Past Medical History:  Diagnosis Date  . Hypertension   . Hypomagnesemia   . Obesity, morbid, BMI 50 or higher ([place])   . Prediabetes   . Vitamin D deficiency    Family history- Reviewed and unchanged Social history- Reviewed and unchanged   Review of Systems:  Review of Systems  Constitutional: Negative for malaise/fatigue and weight loss.  HENT: Negative for hearing loss and tinnitus.   Eyes: Negative for blurred vision and double vision.  Respiratory: Positive for shortness of breath (With exertion; secondary to body habitus - consistent with baseline). Negative for cough and wheezing.   Cardiovascular: Negative for chest pain, palpitations, orthopnea, claudication and leg swelling.  Gastrointestinal: Negative for abdominal pain, blood in stool, constipation, diarrhea, heartburn, melena, nausea and vomiting.  Genitourinary: Negative.   Musculoskeletal: Negative for joint pain and myalgias.  Skin: Negative for  rash.  Neurological: Negative for dizziness, tingling, sensory change, weakness and headaches.  Endo/Heme/Allergies: Negative for polydipsia.  Psychiatric/Behavioral: Negative.  Negative for depression and substance abuse. The patient is not nervous/anxious.   All other systems reviewed and are negative.     Physical Exam: BP 136/84   Pulse 86   Temp (!) 97.5 F (36.4 C)   Ht 5\' 7"  (1.702 m)   LMP 04/03/2017 (Exact Date)   SpO2 95%   BMI 64.37 kg/m  Wt Readings from Last 3 Encounters:  03/28/15 (!) 411 lb (186.4 kg)  04/24/14 (!) 399 lb (181 kg)  02/08/14 (!) 394 lb (178.7 kg)   General Appearance: Morbidly obese in no apparent distress. Eyes: PERRLA, EOMs, conjunctiva no swelling or erythema Sinuses: No Frontal/maxillary tenderness ENT/Mouth: Ext aud canals clear, TMs without erythema, bulging. No erythema, swelling, or exudate on post pharynx.  Tonsils not swollen or erythematous. Hearing normal.  Neck: Supple, thyroid normal.  Respiratory: Respiratory effort normal, BS difficult to hear secondary to body habitus. Cardio: Difficult to hear secondary to body habitus -  RRR with no audible MRGs. Distal pulses 1+ distally.  Abdomen: Soft, + BS.  Non tender, no guarding, rebound, palpable hernias, masses. Lymphatics: Non tender without lymphadenopathy.  Musculoskeletal: Full ROM, 5/5 strength, Normal gait Skin: Warm, dry without rashes, lesions,  ecchymosis.  Neuro: Cranial nerves intact. No cerebellar symptoms.  Psych: Awake and oriented X 3, normal affect, Insight and Judgment appropriate.    Izora Ribas, NP 10:10 AM [facility]

## 2017-04-04 ENCOUNTER — Ambulatory Visit: Payer: Commercial Managed Care - HMO | Admitting: Adult Health

## 2017-04-04 ENCOUNTER — Encounter: Payer: Self-pay | Admitting: Adult Health

## 2017-04-04 VITALS — BP 136/84 | HR 86 | Temp 97.5°F | Ht 67.0 in

## 2017-04-04 DIAGNOSIS — I10 Essential (primary) hypertension: Secondary | ICD-10-CM

## 2017-04-04 DIAGNOSIS — R7303 Prediabetes: Secondary | ICD-10-CM | POA: Diagnosis not present

## 2017-04-04 DIAGNOSIS — E559 Vitamin D deficiency, unspecified: Secondary | ICD-10-CM | POA: Diagnosis not present

## 2017-04-04 DIAGNOSIS — Z6841 Body Mass Index (BMI) 40.0 and over, adult: Secondary | ICD-10-CM

## 2017-04-04 DIAGNOSIS — Z79899 Other long term (current) drug therapy: Secondary | ICD-10-CM | POA: Diagnosis not present

## 2017-04-04 DIAGNOSIS — E039 Hypothyroidism, unspecified: Secondary | ICD-10-CM

## 2017-04-04 NOTE — Patient Instructions (Addendum)
Who Qualifies for Obesity Medications? Although everyone is hopeful for a fast and easy way to lose weight, nothing has been shown to replace a prudent, calorie-controlled diet along with behavior modification as a cornerstone for all obesity treatments.  The next tool that can be used to achieve weight-loss and health improvement is medication. Pharmacotherapy may be offered to individuals affected by obesity who have failed to achieve weight-loss through diet and exercise alone. Currently there are several drugs that are approved by the FDA for weight-loss: phentermine products (Adipex-P or Suprenza)  lorcaserin HCI (Belviq) phentermine- topiramate ER (Qsymia)  Bupropion; Naltrexone ER (Contrave)  Saxenda (liraglutide), once a day weight loss shot Let's take a closer look at each of these medications and learn how they work:  Phentermine (Adipex-P or Suprenza) How does it work? Phentermine is a medication available by prescription that works on chemicals in the brain to decrease your appetite. It also has a mild stimulant component that adds extra energy. Phentermine is a pill that is taken once a day in the morning time. Tolerance to this medication can develop, so it can only be used for several months at a time. Common side effects are dry mouth, sleeplessness, constipation. Weight-loss: The average weight-loss is 4-5 percent of your weight after one-year. In a 200 pound person, this means about 10 pounds of weight-loss. Patients who receive phentermine can usually expect to see greater weight-loss than those who receive non-pharmacologic care, on average about 13 pounds difference over 12 weeks as reported in one study. Concerns: Due to its stimulant effect, a person's blood pressure and heart rate may increase when on this medication; therefore, you must be monitored closely by a physician who is experienced in prescribing this medication. It cannot be used in patients with some heart  conditions (such as poorly controlled blood pressure), glaucoma (increased pressure in your eye), stroke or overactive thyroid. There is some concern for abuse, but this is minimal if the medication is appropriately used as directed by a healthcare professional.  Lorcaserin (Belviq) How does it work? Lorcaserin was approved in June 2012 by the FDA and became commercially available in June 2013. It works by helping you feel full while eating less, and it works on the chemicals in your brain to help decrease your appetite. Weight-loss: In individuals who took the medication for one-year, it has been shown to have an average of 7 percent weight-loss. In a 200 pound person, this would mean a 14 pound weight-loss. Blood sugar, cholesterol and blood pressure levels have also been shown to improve. Concerns: The most common side effects are headache, dizziness, fatigue, dry mouth, upper YIR:SWNIOEVO tract infection and nausea.  Response to therapy should be evaluated by week 12.  If a patient has not lost at least 5% of baseline body weigh  Phentermine-Topiramate ER (Qsymia) How does it work? This combination medication was approved by the FDA in July 2012. Topiramate is a medication used to treat seizures. It was found that a common side effect of this medication was weight-loss. Phentermine, as described in this brochure, helps to increase your energy and decrease your appetite. Weight-loss: Among individuals who took the highest does of Qsymia (15 mg phentermine and 92 mg of topiramate ER) for one-year, they achieved an average of 14.4 percent weight-loss. In a 200 pound person, a 14.4 percent weight-loss would mean a loss of 29 pounds. Cholesterol levels have also been shown to improve. Concerns: The most common side effects were dry mouth, constipation  and pins-and-needle feeling in extremities. Qsymia should NOT be taken during pregnancy since Topiramate ER, a component of Qsymia, has been  associated with an increased risk of birth defects.  Bupropion; Naltrexone ER (Contrave) How does it work? Works in two areas of your brain, hunger center and reward center to reduce hunger and cravings.  Weight loss In a 56 week trial patients lost more than 5% of their body weight.  Concerns Most common side effects are dry mouth, constipation or diarrhea, headache.  Please take it with a full glass of water and low fat meal.      Follow-up Visits: Patients are given the opportunity to revisit a topic or obtain more information on an area of interest during follow-up visits. The frequency of and interval between follow-up visits is determined on a patient-by-patient basis. Frequent visits (every 3 to 4 weeks) are encouraged until initial weight-loss goals (5 to 10 percent of body weight) are achieved. At that point, less frequent visits are typically scheduled as needed for individual patients. However, since obesity is considered a chronic life-long problem for many individuals, periodic continual follow up is recommended.   Research has shown that weight-loss as low as 5 percent of initial body weight can lead to favorable improvements in blood pressure, cholesterol, glucose levels and insulin sensitivity. The risk of developing heart disease is reduced the most in patients who have impaired glucose tolerance, type 2 diabetes or high blood pressure.      Start walking daily - aim to work up to 10000 steps daily  [place] job with water!  Focus on adding good things - vegetables, fruits, beans - and watching portion sizes to eat in moderation.  Look up portion sizes - 1/2 cup cooked vegetable/ 1 cup leafy green, 1 small piece of fruit - is a serving size.   Protein portion should be about the size of your palm at each meal  Aim for at least 50% of your meal to be fruits and vegetables   We want weight loss that will last so you should lose 1-2 pounds a week.  THAT IS IT! Please  pick THREE things a month to change. Once it is a habit check off the item. Then pick another three items off the list to become habits.  If you are already doing a habit on the list GREAT!  Cross that item off! o Don't drink your calories. Ie, alcohol, soda, fruit juice, and sweet tea.  o Drink more water. Drink a glass when you feel hungry or before each meal.  o Eat breakfast - Complex carb and protein (likeDannon light and fit yogurt, oatmeal, fruit, eggs, [place] bacon). o Measure your cereal.  Eat no more than one cup a day. (ie [place]) o Eat an apple a day. o Add a vegetable a day. o Try a new vegetable a month. o Use Pam! Stop using oil or butter to cook. o Don't finish your plate or use smaller plates. o Share your dessert. o Eat sugar free Jello for dessert or frozen grapes. o Don't eat 2-3 hours before bed. o Switch to whole wheat bread, pasta, and brown rice. o Make healthier choices when you eat out. No fries! o Pick baked chicken, NOT fried. o Don't forget to SLOW DOWN when you eat. It is not going anywhere.  o Take the stairs. o Park far away in the parking lot o [employer] (or weights) for 10 minutes while watching TV. o Walk  at work for 10 minutes during break. o Walk outside 1 time a week with your friend, kids, dog, or significant other. o Start a walking group at [place] the mall as much as you can tolerate.  o Keep a food diary. o Weigh yourself daily. o Walk for 15 minutes 3 days per week. o Cook at home more often and eat out less.  If life happens and you go back to old habits, it is okay.  Just start over. You can do it!   If you experience chest pain, get short of breath, or tired during the exercise, please stop immediately and inform your doctor.

## 2017-04-05 ENCOUNTER — Other Ambulatory Visit: Payer: Self-pay

## 2017-04-05 LAB — HEPATIC FUNCTION PANEL
AG RATIO: 1.3 (calc) (ref 1.0–2.5)
ALKALINE PHOSPHATASE (APISO): 50 U/L (ref 33–115)
ALT: 27 U/L (ref 6–29)
AST: 23 U/L (ref 10–30)
Albumin: 3.9 g/dL (ref 3.6–5.1)
BILIRUBIN INDIRECT: 0.3 mg/dL (ref 0.2–1.2)
Bilirubin, Direct: 0.1 mg/dL (ref 0.0–0.2)
Globulin: 3 g/dL (calc) (ref 1.9–3.7)
TOTAL PROTEIN: 6.9 g/dL (ref 6.1–8.1)
Total Bilirubin: 0.4 mg/dL (ref 0.2–1.2)

## 2017-04-05 LAB — BASIC METABOLIC PANEL WITH GFR
BUN: 11 mg/dL (ref 7–25)
CO2: 32 mmol/L (ref 20–32)
CREATININE: 0.64 mg/dL (ref 0.50–1.10)
Calcium: 9.4 mg/dL (ref 8.6–10.2)
Chloride: 100 mmol/L (ref 98–110)
GFR, Est African American: 140 mL/min/{1.73_m2} (ref >=60)
GFR, Est Non African American: 121 mL/min/{1.73_m2} (ref >=60)
Glucose, Bld: 103 mg/dL — ABNORMAL HIGH (ref 65–99)
POTASSIUM: 4.8 mmol/L (ref 3.5–5.3)
SODIUM: 138 mmol/L (ref 135–146)

## 2017-04-05 LAB — CBC WITH DIFFERENTIAL/PLATELET
BASOS ABS: 63 {cells}/uL (ref 0–200)
Basophils Relative: 0.7 %
EOS ABS: 171 {cells}/uL (ref 15–500)
Eosinophils Relative: 1.9 %
HCT: 40.2 % (ref 35.0–45.0)
HEMOGLOBIN: 13.1 g/dL (ref 11.7–15.5)
Lymphs Abs: 2079 cells/uL (ref 850–3900)
MCH: 29.5 pg (ref 27.0–33.0)
MCHC: 32.6 g/dL (ref 32.0–36.0)
MCV: 90.5 fL (ref 80.0–100.0)
MONOS PCT: 5.2 %
MPV: 10.5 fL (ref 7.5–12.5)
NEUTROS PCT: 69.1 %
Neutro Abs: 6219 cells/uL (ref 1500–7800)
PLATELETS: 299 10*3/uL (ref 140–400)
RBC: 4.44 10*6/uL (ref 3.80–5.10)
RDW: 12.8 % (ref 11.0–15.0)
TOTAL LYMPHOCYTE: 23.1 %
WBC mixed population: 468 cells/uL (ref 200–950)
WBC: 9 10*3/uL (ref 3.8–10.8)

## 2017-04-05 LAB — HEMOGLOBIN A1C
EAG (MMOL/L): 7 (calc)
HEMOGLOBIN A1C: 6 %{Hb} — AB (ref <5.7)
Mean Plasma Glucose: 126 (calc)

## 2017-04-05 LAB — LIPID PANEL
Cholesterol: 119 mg/dL (ref <200)
HDL: 42 mg/dL — AB (ref >50)
LDL Cholesterol (Calc): 59 mg/dL (calc)
NON-HDL CHOLESTEROL (CALC): 77 mg/dL (ref <130)
Total CHOL/HDL Ratio: 2.8 (calc) (ref <5.0)
Triglycerides: 93 mg/dL (ref <150)

## 2017-04-05 LAB — TSH: TSH: 1.79 m[IU]/L

## 2017-04-05 LAB — VITAMIN D 25 HYDROXY (VIT D DEFICIENCY, FRACTURES): VIT D 25 HYDROXY: 18 ng/mL — AB (ref 30–100)

## 2017-04-05 LAB — INSULIN, RANDOM: Insulin: 49.9 u[IU]/mL — ABNORMAL HIGH (ref 2.0–19.6)

## 2017-04-05 MED ORDER — PHENTERMINE HCL 37.5 MG PO CAPS: ORAL_CAPSULE | ORAL | 2 refills | Status: DC

## 2017-04-28 ENCOUNTER — Other Ambulatory Visit: Payer: Self-pay | Admitting: Physician Assistant

## 2017-04-28 DIAGNOSIS — I10 Essential (primary) hypertension: Secondary | ICD-10-CM

## 2017-05-11 ENCOUNTER — Encounter: Payer: Self-pay | Admitting: Adult Health

## 2017-05-11 ENCOUNTER — Encounter (INDEPENDENT_AMBULATORY_CARE_PROVIDER_SITE_OTHER): Payer: Self-pay

## 2017-05-24 ENCOUNTER — Telehealth: Payer: 59 | Admitting: Family

## 2017-05-24 DIAGNOSIS — J069 Acute upper respiratory infection, unspecified: Secondary | ICD-10-CM

## 2017-05-24 MED ORDER — FLUTICASONE PROPIONATE 50 MCG/ACT NA SUSP: 2.0000 | Freq: Every day | NASAL | 0 refills | Status: DC

## 2017-05-24 MED ORDER — BENZONATATE 100 MG PO CAPS
100.0000 mg | ORAL_CAPSULE | Freq: Two times a day (BID) | ORAL | 0 refills | Status: DC | PRN
Start: 2017-05-24 — End: 2018-01-25

## 2017-05-24 NOTE — Progress Notes (Signed)

## 2017-06-05 ENCOUNTER — Other Ambulatory Visit: Payer: Self-pay | Admitting: Internal Medicine

## 2017-06-05 MED ORDER — PROMETHAZINE-DM 6.25-15 MG/5ML PO SYRP: ORAL_SOLUTION | ORAL | 0 refills | Status: DC

## 2017-06-05 MED ORDER — AZITHROMYCIN 250 MG PO TABS: ORAL_TABLET | ORAL | 0 refills | Status: DC

## 2017-06-05 MED ORDER — PREDNISONE 10 MG PO TABS: ORAL_TABLET | ORAL | 0 refills | Status: DC

## 2017-06-08 ENCOUNTER — Ambulatory Visit: Payer: Self-pay | Admitting: Physician Assistant

## 2017-06-08 ENCOUNTER — Encounter: Payer: Self-pay | Admitting: Adult Health

## 2017-06-08 ENCOUNTER — Ambulatory Visit (HOSPITAL_COMMUNITY)
Admission: RE | Admit: 2017-06-08 | Discharge: 2017-06-08 | Disposition: A | Discharge status: Home / Self Care | Payer: 59 | Source: Ambulatory Visit | Attending: Adult Health | Admitting: Adult Health

## 2017-06-08 ENCOUNTER — Ambulatory Visit: Payer: 59 | Admitting: Adult Health

## 2017-06-08 VITALS — BP 104/60 | HR 92 | Temp 98.1°F

## 2017-06-08 DIAGNOSIS — R059 Cough, unspecified: Secondary | ICD-10-CM

## 2017-06-08 DIAGNOSIS — R062 Wheezing: Secondary | ICD-10-CM | POA: Diagnosis not present

## 2017-06-08 DIAGNOSIS — R05 Cough: Secondary | ICD-10-CM

## 2017-06-08 MED ORDER — PROMETHAZINE-CODEINE 6.25-10 MG/5ML PO SYRP
5.0000 mL | ORAL_SOLUTION | Freq: Four times a day (QID) | ORAL | 0 refills | Status: DC | PRN
Start: 2017-06-08 — End: 2018-01-25

## 2017-06-08 MED ORDER — ALBUTEROL SULFATE HFA 108 (90 BASE) MCG/ACT IN AERS
2.0000 | INHALATION_SPRAY | RESPIRATORY_TRACT | 0 refills | Status: DC | PRN
Start: 2017-06-08 — End: 2018-01-25

## 2017-06-08 NOTE — Progress Notes (Signed)
Assessment and Plan:  Kathryn Chan was seen today for uri.  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 Max: 38mL per day  Wheezing -     DG Chest 2 View; Future -     albuterol (VENTOLIN HFA) 108 (90 Base) MCG/ACT inhaler; Inhale 2 puffs into the lungs every 4 (four) hours as needed for wheezing or shortness of breath.  Further disposition pending results of labs. Discussed med's effects and SE's.   Over 15 minutes of exam, counseling, chart review, and critical decision making was performed.   Future Appointments  Date Time Provider [place]  06/08/2017 10:00 AM Liane Comber, NP [facility] None  08/10/2017  8:45 AM Liane Comber, NP [facility] None    ------------------------------------------------------------------------------------------------------------------   HPI BP 104/60   Pulse 92   Temp 98.1 F (36.7 C)   SpO2 96%   32 y.o.female presents for cough/recent fever. She reports she had a "head cold" 2 weeks ago - 4 days ago woke up with 103 fever, headache, congestion, cough, body aches, ,feeling disoriented. She called the emergency number and Dr. Melford Aase prescribed zpak, prednisone, cough syrup - fever has resolved as of yesterday, continues to have a productive cough, some accompanying wheezing, mild headache just with coughing, congestion, L ear pressure/crackles. Speaks in complete sentences.   Reports she has not slept well due to the coughing despite promethazine-DM cough syrup and tessalon.   Did not have flu vaccine this  year. , Past Medical History:  Diagnosis Date  . Hypertension   . Hypomagnesemia   . Obesity, morbid, BMI 50 or higher ([place])   . Prediabetes   . Vitamin D deficiency      Allergies  Allergen Reactions  . Phentermine     Hyperkalemia    Current Outpatient Medications on File Prior to Visit  Medication Sig  . azithromycin (ZITHROMAX) 250 MG tablet Take 2 tablets (500 mg) on  Day 1,  followed by 1 tablet (250 mg) once daily on Days 2 through 5.  . benzonatate (TESSALON) 100 MG capsule Take 1 capsule (100 mg total) by mouth 2 (two) times daily as needed for cough.  . cholecalciferol (VITAMIN D) 1000 UNITS tablet Take 1,000 Units by mouth daily.  . fluticasone (FLONASE) 50 MCG/ACT nasal spray Place 2 sprays into both nostrils daily.  Marland Kitchen levothyroxine (SYNTHROID, LEVOTHROID) 50 MCG tablet TAKE ONE-HALF TABLET BY MOUTH ONCE DAILY  . lisinopril-hydrochlorothiazide (PRINZIDE,ZESTORETIC) 20-25 MG tablet TAKE 1 TABLET BY MOUTH ONCE DAILY  . Multiple Vitamin (MULTIVITAMIN) capsule Take 1 capsule by mouth daily.  . phentermine 37.5 MG capsule Take 1/2 to 1 capsule by mouth everyday.  . predniSONE (DELTASONE) 10 MG tablet 1 tab 3 x day for 2 days, then 1 tab 2 x day for 2 days, then 1 tab 1 x day for 3 days  . promethazine-dextromethorphan (PROMETHAZINE-DM) 6.25-15 MG/5ML syrup Take 1 to 2 tsp enery 4 hours if needed for cough   No current facility-administered medications on file prior to visit.  ROS: Review of Systems  Constitutional: Positive for malaise/fatigue. Negative for chills, diaphoresis and fever.  HENT: Positive for congestion. Negative for ear discharge, ear pain, hearing loss, sinus pain, sore throat and tinnitus.   Eyes: Negative for blurred vision, pain, discharge and redness.  Respiratory: Positive for cough and wheezing (Mild, just after coughing). Negative for hemoptysis, sputum production, shortness of breath and stridor.   Cardiovascular: Negative for chest pain,  palpitations and orthopnea.  Gastrointestinal: Negative for abdominal pain, diarrhea, nausea and vomiting.  Genitourinary: Negative.   Musculoskeletal: Negative for joint pain and myalgias.  Skin: Negative for rash.  Neurological: Positive for headaches (Mild, just with coughing). Negative for dizziness, sensory change and weakness.  Endo/Heme/Allergies: Negative for environmental allergies.  Psychiatric/Behavioral: Negative.   All other systems reviewed and are negative.  Physical Exam:  BP 104/60   Pulse 92   Temp 98.1 F (36.7 C)   SpO2 96%   General Appearance: Morbidly obese, in no acute distress. Eyes: PERRLA, EOMs, conjunctiva no swelling or erythema Sinuses: No Frontal/maxillary tenderness ENT/Mouth: Ext aud canals clear, TMs without erythema, bulging. Left ear with fluid effusion. No erythema, swelling, or exudate on post pharynx.  Tonsils not swollen or erythematous. Hearing normal.  Neck: Supple, thyroid normal.  Respiratory: Respiratory effort normal, reactive cough with deep breaths, auscultation limited by body habitus - no audible wheezing/rhonchi - sounds diminished/distant throughout Cardio: RRR, sounds distant, exam limited by body habitus.  Pulses symmetrical and palpable bilaterally, no pitting edema. Abdomen: Soft, + BS.  Non tender. Lymphatics: Non tender without lymphadenopathy.  Musculoskeletal: Full ROM, symmetrical strength, normal gait.  Skin: Warm, dry without rashes, lesions, ecchymosis.  Neuro: Cranial nerves intact. Normal muscle tone, no cerebellar symptoms.  Psych: Awake and oriented X 3, normal affect, Insight and Judgment appropriate.     Izora Ribas, NP 9:57 AM [facility]

## 2017-06-08 NOTE — Patient Instructions (Signed)

## 2017-07-20 ENCOUNTER — Other Ambulatory Visit: Payer: Self-pay | Admitting: Adult Health

## 2017-07-20 ENCOUNTER — Encounter: Payer: Self-pay | Admitting: Adult Health

## 2017-07-20 DIAGNOSIS — I10 Essential (primary) hypertension: Secondary | ICD-10-CM

## 2017-07-20 MED ORDER — LISINOPRIL-HYDROCHLOROTHIAZIDE 20-25 MG PO TABS: ORAL_TABLET | ORAL | 1 refills | Status: DC

## 2017-08-10 ENCOUNTER — Ambulatory Visit: Payer: Self-pay | Admitting: Adult Health

## 2017-11-09 ENCOUNTER — Ambulatory Visit: Payer: Self-pay | Admitting: Adult Health

## 2018-01-04 ENCOUNTER — Ambulatory Visit: Payer: Self-pay | Admitting: Adult Health

## 2018-01-18 ENCOUNTER — Telehealth: Payer: 59 | Admitting: Nurse Practitioner

## 2018-01-18 DIAGNOSIS — R059 Cough, unspecified: Secondary | ICD-10-CM

## 2018-01-18 DIAGNOSIS — R05 Cough: Secondary | ICD-10-CM

## 2018-01-18 NOTE — Progress Notes (Signed)

## 2018-01-24 NOTE — Progress Notes (Signed)
FOLLOW UP  Assessment and Plan:   Hypertension Well controlled with current medications  Monitor blood pressure at home; patient to call if consistently greater than 130/80 Continue DASH diet.   Reminder to go to the ER if any CP, SOB, nausea, dizziness, severe HA, changes vision/speech, left arm numbness and tingling and jaw pain.  Hypothyroidism continue medications the same; synthroid 25 mcg daily adjust as indicated pending lab results reminded to take on an empty stomach 30-2mins before food.  check TSH level  Prediabetes Discussed disease and risks Discussed diet/exercise, weight management  A1C, insulin level  Morbid obesity with BMI of 50+, adult ([place]) Long discussion about weight loss, diet, and exercise Has restarted small sustainable lifestyle changes - smaller portions, getting up to walk during the day more, cut out soda, identifying small changes that she can sustain Continue pushing water Discussed trying out the stationary bike at [facility] a few times weekly  Discussed wellbutrin or possibly GLP-1 inhibitor if needed in the future Will follow up in 3 months   Vitamin D Def/ osteoporosis prevention Continue supplementation Check Vit D level  Continue diet and meds as discussed. Further disposition pending results of labs. Discussed med's effects and SE's.   Over 30 minutes of exam, counseling, chart review, and critical decision making was performed.   No future appointments.  ----------------------------------------------------------------------------------------------------------------------  HPI 32 y.o. female  presents for 6 month follow up on hypertension, cholesterol, hypothyroid, prediabetes, morbid obesity and vitamin D deficiency.  BMI is There is no height or weight on file to calculate BMI., current scale cannot measure her weight. she is making small changes - cutting out soda, bread, avoiding fried foods. She did get a gym membership as advised but  has not started going due to lack of social support, but now feeling she would be up to going a few times a week and trying the stationary bicycle. She has been making efforts to get up and down more frequently at work, taking the long was to walk, etc. She has been aiming to drink 1 gallon of water daily. She is cutting down on sweets, portioning using small bags, etc. She reports in the past week she has noted less swelling in her ankles, energy levels are beginning to improve. We have discussed weight loss medications, she has tried phentermine in the past, would like to continue with current lifestyle efforts for 3 months months, then may consider wellbutrin.  Wt Readings from Last 3 Encounters:  03/28/15 (!) 411 lb (186.4 kg)  04/24/14 (!) 399 lb (181 kg)  02/08/14 (!) 394 lb (178.7 kg)   Her blood pressure has been controlled at home, today [name] is BP: 127/77   She does not workout, but trying to get up and down more at work, taking the long way away. She denies chest pain, shortness of breath, dizziness.    She is not on cholesterol medication and denies myalgias. Her cholesterol is at goal. The cholesterol last visit was:   Lab Results  Component Value Date   CHOL 119 04/04/2017   HDL 42 (L) 04/04/2017   LDLCALC 59 04/04/2017   TRIG 93 04/04/2017   CHOLHDL 2.8 04/04/2017    She has been working on diet  for prediabetes, and denies increased appetite, nausea, paresthesia of the feet, polydipsia, polyuria and visual disturbances. Last A1C in the office was:  Lab Results  Component Value Date   HGBA1C 6.0 (H) 04/04/2017   She is on thyroid medication.  Her medication was not changed last visit.   Lab Results  Component Value Date   TSH 1.79 04/04/2017   Patient is on Vitamin D supplement but remained below goal at the last check:    Lab Results  Component Value Date   VD25OH 18 (L) 04/04/2017       Current Medications:  Current Outpatient Medications on File Prior to Visit   Medication Sig  . cholecalciferol (VITAMIN D) 1000 UNITS tablet Take 1,000 Units by mouth daily.  . Cholecalciferol 5000 units TABS Take 1 tablet by mouth daily.  . Multiple Vitamin (MULTIVITAMIN) capsule Take 1 capsule by mouth daily.   No current facility-administered medications on file prior to visit.      Allergies:  Allergies  Allergen Reactions  . Phentermine     Hyperkalemia     Medical History:  Past Medical History:  Diagnosis Date  . Hypertension   . Hypomagnesemia   . Obesity, morbid, BMI 50 or higher ([place])   . Prediabetes   . Vitamin D deficiency    Family history- Reviewed and unchanged Social history- Reviewed and unchanged   Review of Systems:  Review of Systems  Constitutional: Negative for malaise/fatigue and weight loss.  HENT: Negative for hearing loss and tinnitus.   Eyes: Negative for blurred vision and double vision.  Respiratory: Positive for shortness of breath (With exertion; secondary to body habitus - consistent with baseline). Negative for cough and wheezing.   Cardiovascular: Negative for chest pain, palpitations, orthopnea, claudication and leg swelling.  Gastrointestinal: Negative for abdominal pain, blood in stool, constipation, diarrhea, heartburn, melena, nausea and vomiting.  Genitourinary: Negative.   Musculoskeletal: Negative for joint pain and myalgias.  Skin: Negative for rash.  Neurological: Negative for dizziness, tingling, sensory change, weakness and headaches.  Endo/Heme/Allergies: Negative for polydipsia.  Psychiatric/Behavioral: Negative.  Negative for depression and substance abuse. The patient is not nervous/anxious.   All other systems reviewed and are negative.     Physical Exam: BP 127/77   Pulse 86   Temp 97.7 F (36.5 C)   LMP 01/11/2018 (Approximate)   SpO2 96%  Wt Readings from Last 3 Encounters:  03/28/15 (!) 411 lb (186.4 kg)  04/24/14 (!) 399 lb (181 kg)  02/08/14 (!) 394 lb (178.7 kg)   General  Appearance: Morbidly obese in no apparent distress. Eyes: PERRLA, EOMs, conjunctiva no swelling or erythema Sinuses: No Frontal/maxillary tenderness ENT/Mouth: Ext aud canals clear, TMs without erythema, bulging. No erythema, swelling, or exudate on post pharynx.  Tonsils not swollen or erythematous. Hearing normal.  Neck: Supple, thyroid normal.  Respiratory: Respiratory effort normal, BS difficult to hear secondary to body habitus. Cardio: Difficult to hear secondary to body habitus -  RRR with no audible MRGs. Distal pulses 1+ distally.  Abdomen: Soft, + BS.  Non tender, no guarding, rebound, palpable hernias, masses. Lymphatics: Non tender without lymphadenopathy.  Musculoskeletal: Full ROM, 5/5 strength, Normal gait Skin: Warm, dry without rashes, lesions, ecchymosis.  Neuro: Cranial nerves intact. No cerebellar symptoms.  Psych: Awake and oriented X 3, normal affect, Insight and Judgment appropriate.    Izora Ribas, NP 12:31 PM [facility]

## 2018-01-25 ENCOUNTER — Encounter: Payer: Self-pay | Admitting: Adult Health

## 2018-01-25 ENCOUNTER — Ambulatory Visit: Payer: 59 | Admitting: Adult Health

## 2018-01-25 VITALS — BP 127/77 | HR 86 | Temp 97.7°F

## 2018-01-25 DIAGNOSIS — R7303 Prediabetes: Secondary | ICD-10-CM

## 2018-01-25 DIAGNOSIS — E039 Hypothyroidism, unspecified: Secondary | ICD-10-CM | POA: Diagnosis not present

## 2018-01-25 DIAGNOSIS — Z79899 Other long term (current) drug therapy: Secondary | ICD-10-CM

## 2018-01-25 DIAGNOSIS — I10 Essential (primary) hypertension: Secondary | ICD-10-CM

## 2018-01-25 DIAGNOSIS — E559 Vitamin D deficiency, unspecified: Secondary | ICD-10-CM

## 2018-01-25 MED ORDER — LISINOPRIL-HYDROCHLOROTHIAZIDE 20-25 MG PO TABS: ORAL_TABLET | ORAL | 1 refills | Status: DC

## 2018-01-25 MED ORDER — LEVOTHYROXINE SODIUM 50 MCG PO TABS: ORAL_TABLET | ORAL | 2 refills | Status: DC

## 2018-01-25 NOTE — Patient Instructions (Signed)
Aim for 7+ servings of fruits and vegetables daily  65-100+ fluid ounces of water or unsweet tea for healthy kidneys  Limit to max 1 drink of alcohol per day; avoid smoking/tobacco  Limit animal fats in diet for cholesterol and heart health - choose grass fed whenever available  Avoid highly processed foods, and foods high in saturated/trans fats  Aim for low stress - take time to unwind and care for your mental health  Aim for 150 min of moderate intensity exercise weekly for heart health, and weights twice weekly for bone health  Aim for 7-9 hours of sleep daily    Drink 1/2 your body weight in fluid ounces of water daily; drink a tall glass of water 30 min before meals  Don't eat until you're stuffed- listen to your stomach and eat until you are 80% full   Try eating off of a salad plate; wait 10 min after finishing before going back for seconds  Start by eating the vegetables on your plate; aim for 50% of your meals to be fruits or vegetables  Then eat your protein - lean meats (grass fed if possible), fish, beans, nuts in moderation  Eat your carbs/starch last ONLY if you still are hungry. If you can, stop before finishing it all  Avoid sugar and flour - the closer it looks to it's original form in nature, typically the better it is for you  Splurge in moderation - "assign" days when you get to splurge and have the "bad stuff" - I like to follow a 80% - 20% plan- "good" choices 80 % of the time, "bad" choices in moderation 20% of the time  Simple equation is: Calories out > calories in = weight loss - even if you eat the bad stuff, if you limit portions, you will still lose weight    8 Critical Weight-Loss Tips That Aren't Diet and Exercise  1. STARVE THE DISTRACTIONS  All too often when we eat, we're also multitasking: watching TV, answering emails, scrolling through social media. These habits are detrimental to having a strong, clear, healthy relationship with  food, and they can hinder our ability to make dietary changes.  In order to truly focus on what you're eating, how much you're eating, why you're eating those specific foods and, most importantly, how those foods make you feel, you need to starve the distractions. That means when you eat, just eat. Focus on your food, the process it went through to end up on your plate, where it came from and how it nourishes you. With this technique, you're more likely to finish a meal feeling satiated.  2.  CONSIDER WHAT YOU'RE NOT WILLING TO DO  This might sound counterintuitive, but it can help provide a "why" when motivation is waning. Declare, in writing, what you are unwilling to do, for example "I am unwilling to be the old dad who cannot play sports with my children".  So consider what you're not willing to accept, write it down, and keep it at the ready.  3.  STOP LABELING FOOD "GOOD" AND "BAD"  You've probably heard someone say they ate something "bad." Maybe you've even said it yourself.  The trouble with 'bad' foods isn't that they'll send you to the grave after a bite or two. The trouble comes when we eat excessive portions of really calorie-dense foods meal after meal, day after day.  Instead of labeling foods as good or bad, think about which foods you can  eat a lot of, and which ones you should just eat a little of. Then, plan ways to eat the foods you really like in portions that fit with your overall goals. A good example of this would be having a slice of pizza alongside a club salad with chicken breast, avocado and a bit of dressing. This is vastly different than 3 slices of pizza, 4 breadsticks with cheese sauce and half of a liter of regular soda.  4.  BRUSH YOUR TEETH AFTER YOU EAT  Getting your mindset in order is important, but sometimes small habits can make a big difference. After eating, you still have the taste of food in their mouth, which often causes people to eat more even if  they are full or engage in a nibble or two of dessert.  Brushing your teeth will remove the taste of food from your mouth, and the clean, minty freshness will serve as a cue that mealtime is over.  5.  FOCUS ON CROWDING NOT CUTTING  The most common first step during 'dieting' is to cut. We cut our portion sizes down, we cut out 'bad' foods, we cut out entire food groups. This act of cutting puts [place] and our minds into scarcity mode.  When something is off-limits, even if you're able to avoid it for a while, you could end up bingeing on it later because you've gone so long without it. So, instead of cutting, focus on crowding. If you crowd your plate and fill it up with more foods like veggies and protein, it simply allows less room for the other stuff. In other words, shift your focus away from what you can't eat, and celebrate the foods that will help you reach your goals.  6.  TAKE TRACKING A STEP FURTHER  Track what you eat, when you ate it, how much you ate and how that food made you feel. Being completely honest with yourself and writing down every single thing that passes through your lips will help you start to notice that maybe you actually do snack, possibly take in more sugar than you thought, eat when you're bored rather than just hungry or maybe that you have a habit of snacking before bed while watching TV.  The difference from simply tracking your food intake is you're taking into account how food makes you feel, as well as what you're doing while you're eating. This is about becoming more mindful of what, when and why you eat.  7.  PRIORITIZE GOOD SLEEP  One of the strongest risk factors for being overweight is poor sleep. When you're feeling tired, you're more likely to choose unhealthy comfort foods and to skip your workout. Additionally, sleep deprivation may slow down your metabolism. [employer]! Therefore, sleeping 7-8 hours per night can help with weight loss without having to change  your diet or increase your physical activity. And if you feel you snore and still wake up tired, talk with me about sleep apnea.  8.  SET ASIDE TIME TO DISCONNECT  Just get out there. Disconnect from the electronics and connect to the elements. Not only will this help reduce stress (a major factor in weight gain) by giving your mind a break from the constant stimulation we've all become so accustomed to, but it may also reprogram your brain to connect with yourself and what you're feeling.        When it comes to diets, agreement about the perfect plan isn't easy to find, even among  the experts. Experts at the [employer] developed an idea known as the Healthy Eating Plate. Just imagine a plate divided into logical, healthy portions.  The emphasis is on diet quality:  Load up on vegetables and fruits - one-half of your plate: Aim for color and variety, and remember that potatoes don't count.  Go for whole grains - one-quarter of your plate: Whole wheat, barley, wheat berries, quinoa, oats, brown rice, and foods made with them. If you want pasta, go with whole wheat pasta.  Protein power - one-quarter of your plate: Fish, chicken, beans, and nuts are all healthy, versatile protein sources. Limit red meat.  The diet, however, does go beyond the plate, offering a few other suggestions.  Use healthy plant oils, such as olive, canola, soy, corn, sunflower and peanut. Check the labels, and avoid partially hydrogenated oil, which have unhealthy trans fats.  If you're thirsty, drink water. Coffee and tea are good in moderation, but skip sugary drinks and limit milk and dairy products to one or two daily servings.  The type of carbohydrate in the diet is more important than the amount. Some sources of carbohydrates, such as vegetables, fruits, whole grains, and beans-are healthier than others.  Finally, stay active.

## 2018-01-26 ENCOUNTER — Encounter: Payer: Self-pay | Admitting: Adult Health

## 2018-01-26 ENCOUNTER — Other Ambulatory Visit: Payer: Self-pay | Admitting: Adult Health

## 2018-01-26 DIAGNOSIS — R945 Abnormal results of liver function studies: Secondary | ICD-10-CM

## 2018-01-26 DIAGNOSIS — R7989 Other specified abnormal findings of blood chemistry: Secondary | ICD-10-CM | POA: Insufficient documentation

## 2018-01-26 LAB — CBC WITH DIFFERENTIAL/PLATELET
BASOS ABS: 63 {cells}/uL (ref 0–200)
BASOS PCT: 0.6 %
EOS ABS: 273 {cells}/uL (ref 15–500)
Eosinophils Relative: 2.6 %
HCT: 40.6 % (ref 35.0–45.0)
HEMOGLOBIN: 13.2 g/dL (ref 11.7–15.5)
LYMPHS ABS: 2363 {cells}/uL (ref 850–3900)
MCH: 29.3 pg (ref 27.0–33.0)
MCHC: 32.5 g/dL (ref 32.0–36.0)
MCV: 90.2 fL (ref 80.0–100.0)
MPV: 10.2 fL (ref 7.5–12.5)
Monocytes Relative: 5 %
NEUTROS ABS: 7277 {cells}/uL (ref 1500–7800)
Neutrophils Relative %: 69.3 %
Platelets: 340 10*3/uL (ref 140–400)
RBC: 4.5 10*6/uL (ref 3.80–5.10)
RDW: 12.9 % (ref 11.0–15.0)
Total Lymphocyte: 22.5 %
WBC mixed population: 525 cells/uL (ref 200–950)
WBC: 10.5 10*3/uL (ref 3.8–10.8)

## 2018-01-26 LAB — VITAMIN D 25 HYDROXY (VIT D DEFICIENCY, FRACTURES): Vit D, 25-Hydroxy: 9 ng/mL — ABNORMAL LOW (ref 30–100)

## 2018-01-26 LAB — COMPLETE METABOLIC PANEL WITH GFR
AG Ratio: 1.4 (calc) (ref 1.0–2.5)
ALBUMIN MSPROF: 4.1 g/dL (ref 3.6–5.1)
ALKALINE PHOSPHATASE (APISO): 46 U/L (ref 33–115)
ALT: 33 U/L — ABNORMAL HIGH (ref 6–29)
AST: 32 U/L — AB (ref 10–30)
BILIRUBIN TOTAL: 0.4 mg/dL (ref 0.2–1.2)
BUN: 10 mg/dL (ref 7–25)
CHLORIDE: 97 mmol/L — AB (ref 98–110)
CO2: 31 mmol/L (ref 20–32)
CREATININE: 0.69 mg/dL (ref 0.50–1.10)
Calcium: 9.6 mg/dL (ref 8.6–10.2)
GFR, Est African American: 135 mL/min/{1.73_m2} (ref >=60)
GFR, Est Non African American: 117 mL/min/{1.73_m2} (ref >=60)
GLUCOSE: 95 mg/dL (ref 65–99)
Globulin: 3 g/dL (calc) (ref 1.9–3.7)
Potassium: 4.4 mmol/L (ref 3.5–5.3)
SODIUM: 136 mmol/L (ref 135–146)
Total Protein: 7.1 g/dL (ref 6.1–8.1)

## 2018-01-26 LAB — HEMOGLOBIN A1C
EAG (MMOL/L): 7.1 (calc)
HEMOGLOBIN A1C: 6.1 %{Hb} — AB (ref <5.7)
MEAN PLASMA GLUCOSE: 128 (calc)

## 2018-01-26 LAB — TSH: TSH: 1.5 mIU/L

## 2018-01-26 LAB — INSULIN, RANDOM: Insulin: 33.3 u[IU]/mL — ABNORMAL HIGH (ref 2.0–19.6)

## 2018-01-26 MED ORDER — CHOLECALCIFEROL 125 MCG (5000 UT) PO TABS: 2.0000 | ORAL_TABLET | Freq: Every day | ORAL | Status: DC

## 2018-03-04 ENCOUNTER — Other Ambulatory Visit: Payer: Self-pay | Admitting: Adult Health

## 2018-03-04 MED ORDER — CLOTRIMAZOLE-BETAMETHASONE 1-0.05 % EX CREA: TOPICAL_CREAM | CUTANEOUS | 1 refills | Status: DC

## 2018-05-10 ENCOUNTER — Ambulatory Visit: Payer: Self-pay | Admitting: Adult Health

## 2018-07-13 ENCOUNTER — Other Ambulatory Visit: Payer: Self-pay

## 2018-07-13 ENCOUNTER — Other Ambulatory Visit: Payer: Self-pay | Admitting: Adult Health

## 2018-07-13 DIAGNOSIS — E039 Hypothyroidism, unspecified: Secondary | ICD-10-CM

## 2018-07-13 DIAGNOSIS — I10 Essential (primary) hypertension: Secondary | ICD-10-CM

## 2018-07-13 MED ORDER — LISINOPRIL-HYDROCHLOROTHIAZIDE 20-25 MG PO TABS: ORAL_TABLET | ORAL | 1 refills | Status: DC

## 2018-07-13 MED ORDER — LEVOTHYROXINE SODIUM 50 MCG PO TABS: ORAL_TABLET | ORAL | 1 refills | Status: DC

## 2018-07-13 NOTE — Telephone Encounter (Signed)
Levothyroxine and Lisinopril refill request

## 2018-07-24 ENCOUNTER — Telehealth: Payer: Self-pay

## 2018-07-24 NOTE — Telephone Encounter (Signed)
Lisinopril-HCTZ is on backorder. Please advise.

## 2018-07-24 NOTE — Progress Notes (Signed)
FOLLOW UP  Assessment and Plan:   Hypertension Well controlled with current medications - will send in lisinopril and hctz separately due to backorder of combo pill Monitor blood pressure at home; patient to call if consistently greater than 130/80 Continue DASH diet.   Reminder to go to the ER if any CP, SOB, nausea, dizziness, severe HA, changes vision/speech, left arm numbness and tingling and jaw pain.  Hypothyroidism continue medications the same; synthroid 25 mcg daily adjust as indicated pending lab results reminded to take on an empty stomach 30-18mins before food.  check TSH level at follow up  Prediabetes Discussed disease and risks Discussed diet/exercise, weight management  A1C, insulin level at next in-person OV  Morbid obesity with BMI of 50+, adult [facility]) Long discussion about weight loss, diet, and exercise Has restarted small sustainable lifestyle changes - smaller portions, getting up to walk during the day more, cut out soda, identifying small changes that she can sustain Continue pushing water Discussed trying out the stationary bike at [employer] a few times weekly  Discussed wellbutrin or possibly GLP-1 inhibitor if needed in the future; she feels she is doing well at this time without medications, will defer as long as she is making progress Will follow up in 3 months   Vitamin D Def/ osteoporosis prevention Continue supplementation Check Vit D level at [facility]  TELEVISIT PER PATIENT PREFERENCE; ALL LABS DEFERRED   Continue diet and meds as discussed. Further disposition pending results of labs. Discussed med's effects and SE's.   Over 30 minutes of exam, counseling, chart review, and critical decision making was performed.   Future Appointments  Date Time Provider [place]  01/31/2019 11:00 AM Liane Comber, NP [facility] None     ----------------------------------------------------------------------------------------------------------------------  HPI 33 y.o. female  presents for 6 month follow up on hypertension, cholesterol, hypothyroid, prediabetes, morbid obesity and vitamin D deficiency.  BMI is There is no height or weight on file to calculate BMI., current scale cannot measure her weight. she is making dietary changes; she has stopped frying foods, using air frier; she stopped fast food for lunch and taking healthy foods (celery with peanut butter, etc). She is eating small regular meals rather than 3 large meals and feels this may a large difference.   She has increased walking; She has been making efforts to get up and down more frequently at work, taking the long was to walk, etc. Gets up at least hourly to walk around. She has been aiming to drink 1 gallon of water daily. She reports she has noted less swelling in her ankles, energy levels improved.   We have discussed weight loss medications, she has tried phentermine in the past, would like to continue with current lifestyle efforts as she continues to see progress. She has noted easier to get in car, jeans and work tops are not as tight. She avoids weighing with scale as this has been detrimental to mental health in the past.  Wt Readings from Last 3 Encounters:  03/28/15 (!) 411 lb (186.4 kg)  04/24/14 (!) 399 lb (181 kg)  02/08/14 (!) 394 lb (178.7 kg)   Her blood pressure has been controlled at home, today [name] is BP: 128/79   She does not workout, but trying to get up and down more at work, taking the long way away. She denies chest pain, shortness of breath, dizziness.    She is not on cholesterol medication and denies myalgias. Her cholesterol is at goal. The  cholesterol last visit was:   Lab Results  Component Value Date   CHOL 119 04/04/2017   HDL 42 (L) 04/04/2017   LDLCALC 59 04/04/2017   TRIG 93 04/04/2017   CHOLHDL 2.8 04/04/2017     She has been working on diet  for prediabetes, and denies increased appetite, nausea, paresthesia of the feet, polydipsia, polyuria and visual disturbances. Last A1C in the office was:  Lab Results  Component Value Date   HGBA1C 6.1 (H) 01/25/2018   She is on thyroid medication. Her medication was not changed last visit.   Lab Results  Component Value Date   TSH 1.50 01/25/2018   Patient is on Vitamin D supplement but remained below goal at the last check:    Lab Results  Component Value Date   VD25OH 9 (L) 01/25/2018   She is since taking 10000 IU daily and tolerating without perceived SE.   Current Medications:  Current Outpatient Medications on File Prior to Visit  Medication Sig  . Cholecalciferol 5000 units TABS Take 2 tablets (10,000 Units total) by mouth daily.  . clotrimazole-betamethasone (LOTRISONE) cream Apply to affected area 2 times daily  . levothyroxine (SYNTHROID, LEVOTHROID) 50 MCG tablet TAKE ONE-HALF TABLET BY MOUTH ONCE DAILY  . lisinopril-hydrochlorothiazide (PRINZIDE,ZESTORETIC) 20-25 MG tablet TAKE 1 TABLET BY MOUTH ONCE DAILY  . Multiple Vitamin (MULTIVITAMIN) capsule Take 1 capsule by mouth daily.   No current facility-administered medications on file prior to visit.      Allergies:  Allergies  Allergen Reactions  . Phentermine     Hyperkalemia     Medical History:  Past Medical History:  Diagnosis Date  . Hypertension   . Hypomagnesemia   . Obesity, morbid, BMI 50 or higher ([place])   . Prediabetes   . Vitamin D deficiency    Family history- Reviewed and unchanged Social history- Reviewed and unchanged   Review of Systems:  Review of Systems  Constitutional: Negative for malaise/fatigue and weight loss.  HENT: Negative for hearing loss and tinnitus.   Eyes: Negative for blurred vision and double vision.  Respiratory: Positive for shortness of breath (With exertion; secondary to body habitus - consistent with baseline). Negative for  cough and wheezing.   Cardiovascular: Negative for chest pain, palpitations, orthopnea, claudication and leg swelling.  Gastrointestinal: Negative for abdominal pain, blood in stool, constipation, diarrhea, heartburn, melena, nausea and vomiting.  Genitourinary: Negative.   Musculoskeletal: Negative for joint pain and myalgias.  Skin: Negative for rash.  Neurological: Negative for dizziness, tingling, sensory change, weakness and headaches.  Endo/Heme/Allergies: Negative for polydipsia.  Psychiatric/Behavioral: Negative.  Negative for depression and substance abuse. The patient is not nervous/anxious.   All other systems reviewed and are negative.   Physical Exam: BP 128/79   Temp 98.6 F (37 C)  Wt Readings from Last 3 Encounters:  03/28/15 (!) 411 lb (186.4 kg)  04/24/14 (!) 399 lb (181 kg)  02/08/14 (!) 394 lb (178.7 kg)   General Appearance: Well nourished well developed, morbidly obese, in no apparent distress.  Eyes: conjunctiva no swelling or erythema ENT/Mouth: No hoarseness, No cough for duration of visit.  Neck: Supple  Respiratory: Respiratory effort normal, normal rate, no retractions or distress.   Cardio: Appears well-perfused, noncyanotic Musculoskeletal: no obvious deformity Skin: visible skin without rashes, ecchymosis, erythema Neuro: Awake and oriented X 3,  Psych:  normal affect, Insight and Judgment appropriate.   Izora Ribas, NP 12:49 PM [facility]

## 2018-07-26 ENCOUNTER — Other Ambulatory Visit: Payer: Self-pay

## 2018-07-26 ENCOUNTER — Ambulatory Visit: Payer: 59 | Admitting: Adult Health

## 2018-07-26 ENCOUNTER — Ambulatory Visit: Payer: Self-pay | Admitting: Adult Health

## 2018-07-26 ENCOUNTER — Encounter: Payer: Self-pay | Admitting: Adult Health

## 2018-07-26 VITALS — BP 128/79 | Temp 98.6°F

## 2018-07-26 DIAGNOSIS — E039 Hypothyroidism, unspecified: Secondary | ICD-10-CM | POA: Diagnosis not present

## 2018-07-26 DIAGNOSIS — R7303 Prediabetes: Secondary | ICD-10-CM

## 2018-07-26 DIAGNOSIS — Z79899 Other long term (current) drug therapy: Secondary | ICD-10-CM

## 2018-07-26 DIAGNOSIS — I10 Essential (primary) hypertension: Secondary | ICD-10-CM | POA: Diagnosis not present

## 2018-07-26 MED ORDER — HYDROCHLOROTHIAZIDE 25 MG PO TABS
25.0000 mg | ORAL_TABLET | Freq: Every day | ORAL | 0 refills | Status: DC
Start: 2018-07-26 — End: 2018-10-29

## 2018-07-26 MED ORDER — LISINOPRIL 20 MG PO TABS: 20.0000 mg | ORAL_TABLET | Freq: Every day | ORAL | 0 refills | Status: DC

## 2018-07-26 NOTE — Progress Notes (Signed)
Pharmacy states that Lisinopril-Hydrochlorothiazide is on back order and requesting separate prescription be sent in.

## 2018-10-29 ENCOUNTER — Other Ambulatory Visit: Payer: Self-pay | Admitting: Internal Medicine

## 2018-10-29 ENCOUNTER — Other Ambulatory Visit: Payer: Self-pay

## 2018-10-29 DIAGNOSIS — I10 Essential (primary) hypertension: Secondary | ICD-10-CM

## 2018-10-29 MED ORDER — HYDROCHLOROTHIAZIDE 25 MG PO TABS: ORAL_TABLET | ORAL | 0 refills | Status: DC

## 2018-10-29 MED ORDER — LISINOPRIL 20 MG PO TABS: ORAL_TABLET | ORAL | 0 refills | Status: DC

## 2018-10-29 MED ORDER — HYDROCHLOROTHIAZIDE 25 MG PO TABS: 25.0000 mg | ORAL_TABLET | Freq: Every day | ORAL | 0 refills | Status: DC

## 2018-12-26 ENCOUNTER — Other Ambulatory Visit: Payer: Self-pay | Admitting: *Deleted

## 2018-12-26 DIAGNOSIS — Z20822 Contact with and (suspected) exposure to covid-19: Secondary | ICD-10-CM

## 2018-12-28 LAB — NOVEL CORONAVIRUS, NAA: SARS-CoV-2, NAA: NOT DETECTED

## 2019-01-23 ENCOUNTER — Other Ambulatory Visit: Payer: Self-pay

## 2019-01-23 DIAGNOSIS — I10 Essential (primary) hypertension: Secondary | ICD-10-CM

## 2019-01-23 MED ORDER — LISINOPRIL 20 MG PO TABS: ORAL_TABLET | ORAL | 0 refills | Status: DC

## 2019-01-23 MED ORDER — HYDROCHLOROTHIAZIDE 25 MG PO TABS: ORAL_TABLET | ORAL | 0 refills | Status: DC

## 2019-01-31 ENCOUNTER — Encounter: Payer: 59 | Admitting: Adult Health

## 2019-03-17 IMAGING — CR DG CHEST 2V
2 series · 2 of 2 positions shown · non-contrast
Comparison: 04/24/2014

CLINICAL DATA: Cough, fever

EXAM:
CHEST  2 VIEW

[chest pa]
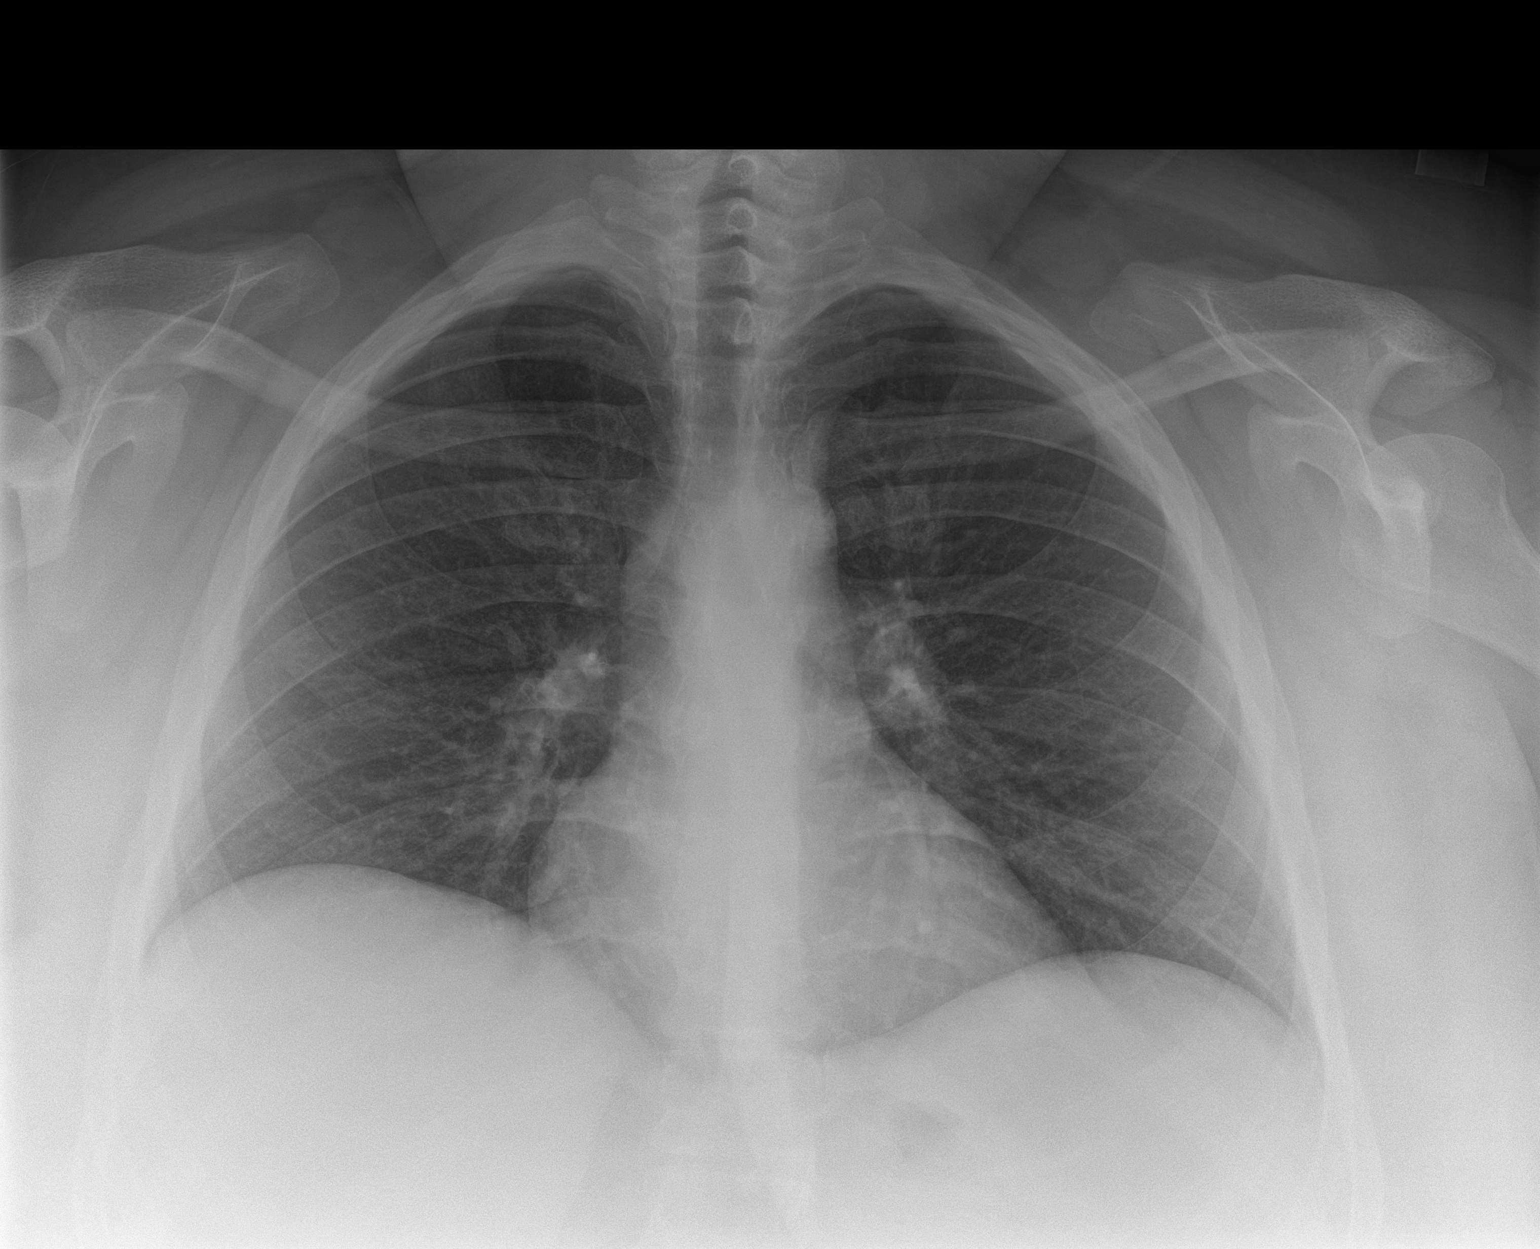

[chest lat]
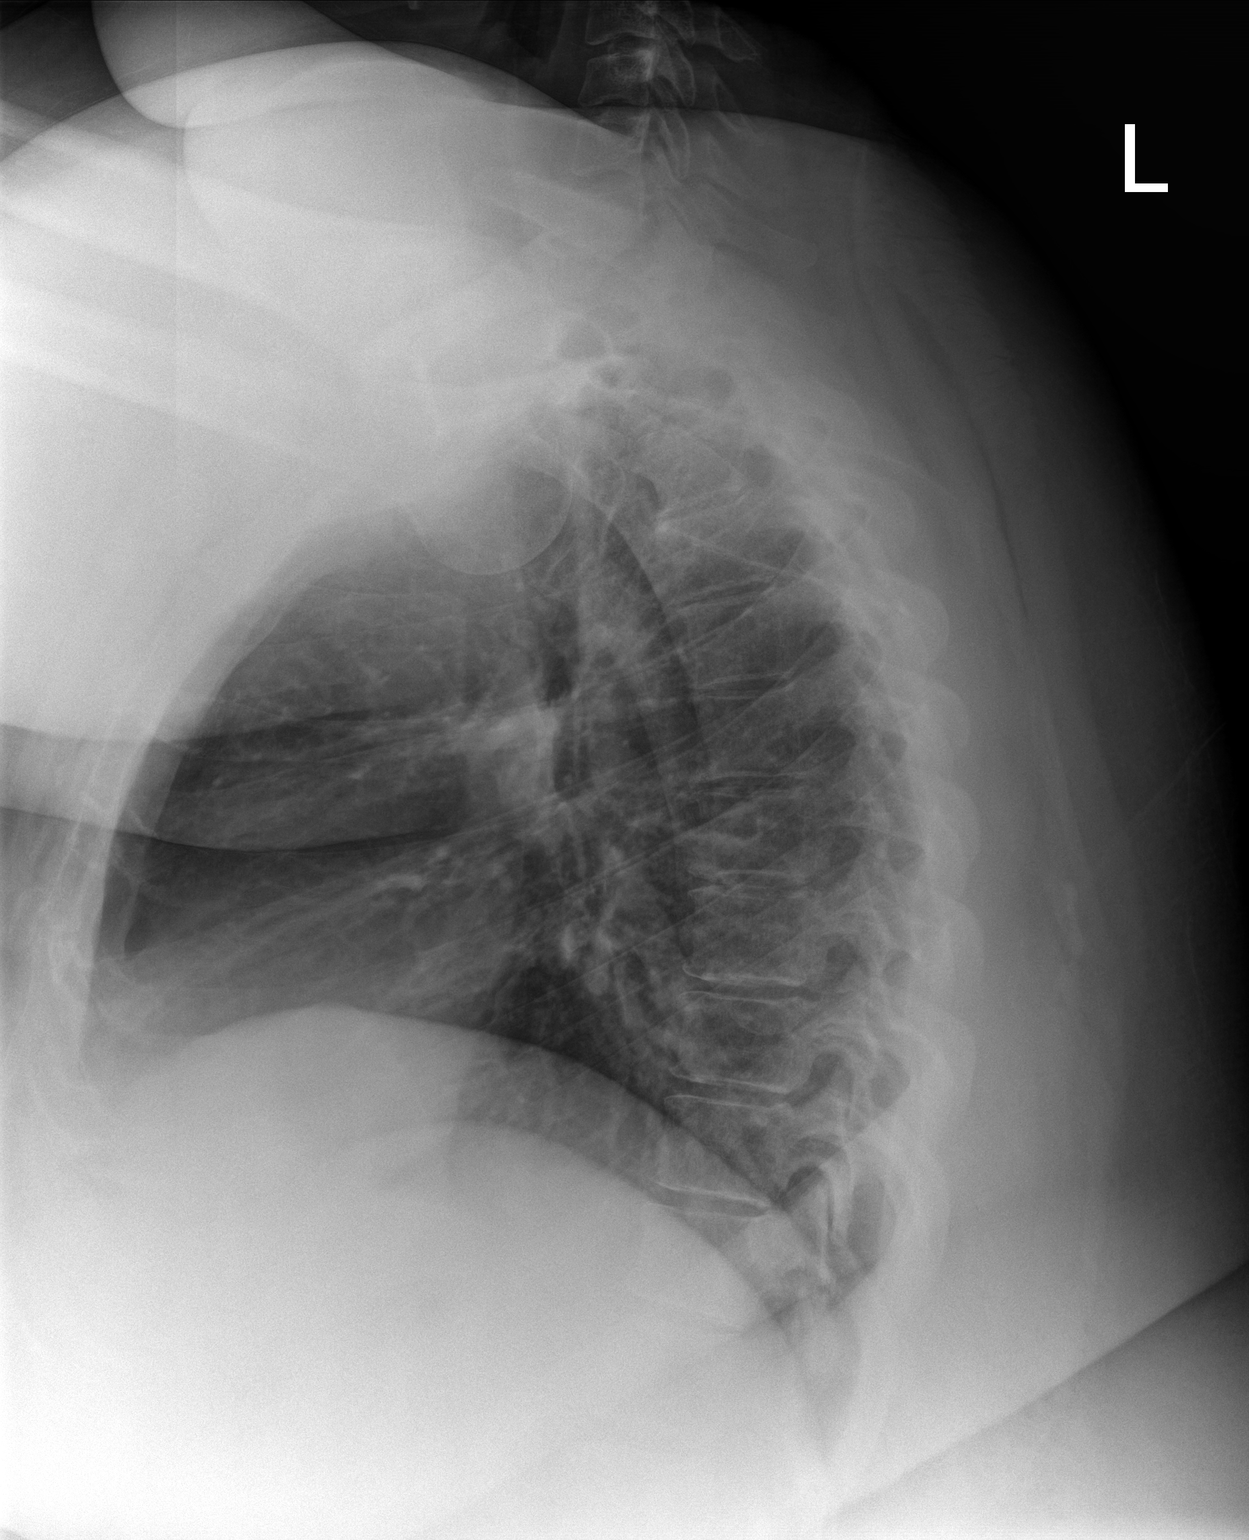

[2 of 2 positions shown; findings below may reference images not displayed]

FINDINGS: Heart and mediastinal contours are within normal limits. No focal
opacities or effusions. No acute bony abnormality.
IMPRESSION: No active cardiopulmonary disease.

## 2019-03-20 ENCOUNTER — Other Ambulatory Visit: Payer: Self-pay

## 2019-03-20 DIAGNOSIS — Z20822 Contact with and (suspected) exposure to covid-19: Secondary | ICD-10-CM

## 2019-03-23 LAB — NOVEL CORONAVIRUS, NAA: SARS-CoV-2, NAA: NOT DETECTED

## 2019-04-02 ENCOUNTER — Other Ambulatory Visit: Payer: Self-pay

## 2019-04-02 DIAGNOSIS — Z20822 Contact with and (suspected) exposure to covid-19: Secondary | ICD-10-CM

## 2019-04-03 LAB — NOVEL CORONAVIRUS, NAA: SARS-CoV-2, NAA: NOT DETECTED

## 2019-04-16 NOTE — Progress Notes (Deleted)
Complete Physical  Assessment and Plan:  Discussed med's effects and SE's. Screening labs and tests as requested with regular follow-up as recommended. Over 40 minutes of exam, counseling, chart review, and complex, high level critical decision making was performed this visit.   HPI  33 y.o. female  presents for a complete physical and follow up for has Prediabetes; Vitamin D deficiency; Hypertension; Obesity, morbid, BMI 50 or higher ([place]); Medication management; Hypothyroid; and Elevated LFTs on their problem list..  Her blood pressure {HAS HAS NOT:18834} been controlled at home, today [name] is   She {DOES_DOES [id number] workout. She denies chest pain, shortness of breath, dizziness.   BMI is There is no height or weight on file to calculate BMI., she is working on diet and exercise. She has elevated LFT's, last AB [place] 2006 s/p choley at that time.  Wt Readings from Last 3 Encounters:  03/28/15 (!) 411 lb (186.4 kg)  04/24/14 (!) 399 lb (181 kg)  02/08/14 (!) 394 lb (178.7 kg)   Lab Results  Component Value Date   ALT 33 (H) 01/25/2018   AST 32 (H) 01/25/2018   ALKPHOS 50 07/21/2016   BILITOT 0.4 01/25/2018   She {ACTION; IS/IS [id number] on cholesterol medication and denies myalgias. Her cholesterol {ACTION; IS/IS NOT:21021397} at goal. The cholesterol last visit was:   Lab Results  Component Value Date   CHOL 119 04/04/2017   HDL 42 (L) 04/04/2017   LDLCALC 59 04/04/2017   TRIG 93 04/04/2017   CHOLHDL 2.8 04/04/2017    She {Has/has not:18111} been working on diet and exercise for prediabetes, she {ACTION; IS/IS NOT:21021397} on bASA, she {ACTION; IS/IS NOT:21021397} on ACE/ARB and denies {Symptoms; diabetes w/o none:19199}. Last A1C in the office was:  Lab Results  Component Value Date   HGBA1C 6.1 (H) 01/25/2018   Last GFR: Lab Results  Component Value Date   [facility] 117 01/25/2018   Patient is on Vitamin D supplement.   Lab Results  Component Value Date    VD25OH 9 (L) 01/25/2018      Current Medications:  Current Outpatient Medications on File Prior to Visit  Medication Sig Dispense Refill  . Cholecalciferol 5000 units TABS Take 2 tablets (10,000 Units total) by mouth daily.    . hydrochlorothiazide (HYDRODIURIL) 25 MG tablet Take 1 tablet Daily for BP, Fluid Retention & Ankle Swelling 90 tablet 0  . levothyroxine (SYNTHROID, LEVOTHROID) 50 MCG tablet TAKE ONE-HALF TABLET BY MOUTH ONCE DAILY 90 tablet 1  . lisinopril (ZESTRIL) 20 MG tablet Take 1 tablet Daily for BP 90 tablet 0  . lisinopril-hydrochlorothiazide (PRINZIDE,ZESTORETIC) 20-25 MG tablet TAKE 1 TABLET BY MOUTH ONCE DAILY 90 tablet 1  . Multiple Vitamin (MULTIVITAMIN) capsule Take 1 capsule by mouth daily.     No current facility-administered medications on file prior to visit.   Allergies:  Allergies  Allergen Reactions  . Phentermine     Hyperkalemia   Medical History:  She has Prediabetes; Vitamin D deficiency; Hypertension; Obesity, morbid, BMI 50 or higher ([place]); Medication management; Hypothyroid; and Elevated LFTs on their problem list. Health Maintenance:   Immunization History  Administered Date(s) Administered  . Td 04/25/2004   Tetanus: Pneumovax: Prevnar 13:  Flu vaccine: Zostavax:  LMP: Pap: 2011 MGM:  DEXA: Colonoscopy: EGD: Echo 2010  Last Dental Exam: Last Eye Exam: Patient Care Team: Kathryn Pinto, MD as PCP - General (Internal Medicine)  Surgical History:  She has a past surgical history that includes Cholecystectomy. Family History:  Herfamily history includes Diabetes in her paternal grandfather; Heart disease in her paternal grandfather; Heart disease (age of onset: 36) in her cousin; Heart disease (age of onset: 18) in her cousin. Social History:  She reports that she has never smoked. She has never used smokeless tobacco. She reports that she does not drink alcohol or use drugs.  Review of Systems: ROS  Physical  Exam: Estimated body mass index is 64.37 kg/m as calculated from the following:   Height as of 04/04/17: 5\' 7"  (1.702 m).   Weight as of 03/28/15: 411 lb (186.4 kg). There were no vitals taken for this visit. General Appearance: Well nourished, in no apparent distress.  Eyes: PERRLA, EOMs, conjunctiva no swelling or erythema, normal fundi and vessels.  Sinuses: No Frontal/maxillary tenderness  ENT/Mouth: Ext aud canals clear, normal light reflex with TMs without erythema, bulging. Good dentition. No erythema, swelling, or exudate on post pharynx. Tonsils not swollen or erythematous. Hearing normal.  Neck: Supple, thyroid normal. No bruits  Respiratory: Respiratory effort normal, BS equal bilaterally without rales, rhonchi, wheezing or stridor.  Cardio: RRR without murmurs, rubs or gallops. Brisk peripheral pulses without edema.  Chest: symmetric, with normal excursions and percussion.  Breasts: Symmetric, without lumps, nipple discharge, retractions.  Abdomen: Soft, nontender, no guarding, rebound, hernias, masses, or organomegaly.  Lymphatics: Non tender without lymphadenopathy.  Genitourinary:  Musculoskeletal: Full ROM all peripheral extremities,5/5 strength, and normal gait.  Skin: Warm, dry without rashes, lesions, ecchymosis. Neuro: Cranial nerves intact, reflexes equal bilaterally. Normal muscle tone, no cerebellar symptoms. Sensation intact.  Psych: Awake and oriented X 3, normal affect, Insight and Judgment appropriate.   EKG: WNL no ST changes. AORTA SCAN: defer  Kathryn Chan 7:58 AM [facility]

## 2019-04-18 ENCOUNTER — Encounter: Payer: 59 | Admitting: Physician Assistant

## 2019-04-18 DIAGNOSIS — E039 Hypothyroidism, unspecified: Secondary | ICD-10-CM

## 2019-04-18 DIAGNOSIS — I10 Essential (primary) hypertension: Secondary | ICD-10-CM

## 2019-04-18 MED ORDER — LEVOTHYROXINE SODIUM 50 MCG PO TABS: ORAL_TABLET | ORAL | 0 refills | Status: DC

## 2019-04-18 MED ORDER — LISINOPRIL 20 MG PO TABS: ORAL_TABLET | ORAL | 0 refills | Status: DC

## 2019-04-18 MED ORDER — HYDROCHLOROTHIAZIDE 25 MG PO TABS: ORAL_TABLET | ORAL | 0 refills | Status: DC

## 2019-06-08 ENCOUNTER — Other Ambulatory Visit: Payer: Self-pay | Admitting: Adult Health

## 2019-06-08 MED ORDER — CLOTRIMAZOLE-BETAMETHASONE 1-0.05 % EX CREA: TOPICAL_CREAM | CUTANEOUS | 1 refills | Status: DC

## 2019-07-25 ENCOUNTER — Other Ambulatory Visit: Payer: Self-pay | Admitting: Physician Assistant

## 2019-07-25 DIAGNOSIS — I10 Essential (primary) hypertension: Secondary | ICD-10-CM

## 2019-09-11 NOTE — Progress Notes (Signed)
FOLLOW UP  Assessment and Plan:   Acute non-recurrent maxillary sinusitis -     triamcinolone (NASACORT) 55 MCG/ACT AERO nasal inhaler; Place 2 sprays into the nose at bedtime. -     azithromycin (ZITHROMAX) 250 MG tablet; Take 2 tablets (500 mg) on  Day 1,  followed by 1 tablet (250 mg) once daily on Days 2 through 5. Will hold the zpak and take if she is not getting better, increase fluids, rest, cont allergy pill    Continue diet and meds as discussed. Further disposition pending results of labs. Discussed med's effects and SE's.   Over 30 minutes of exam, counseling, chart review, and critical decision making was performed.   Future Appointments  Date Time Provider [place]  09/12/2019  1:15 PM [place]: [place], [place] [facility]  09/12/2019  2:30 PM Vicie Mutters, PA-C [id number] None    ----------------------------------------------------------------------------------------------------------------------  HPI 34 y.o. female presents for acute visit.  She woke up Saturday with cough, sinus issues. Then Sunday she had severe fatigue, chills, subjective fever but never got above 97.8. She had severe headache Sunday, felt congestion/nasal pressure feeling. No sinus discharge. Tylenol has not helped.  She got tested today, pending test.  She does not want steroids.  No SOB, no wheezing.   Current Medications:  Current Outpatient Medications on File Prior to Visit  Medication Sig  . Cholecalciferol 5000 units TABS Take 2 tablets (10,000 Units total) by mouth daily.  . clotrimazole-betamethasone (LOTRISONE) cream Apply to affected area 2 times daily  . hydrochlorothiazide (HYDRODIURIL) 25 MG tablet TAKE 1 TABLET BY MOUTH ONCE DAILY FOR BLOOD PRESSURE FLUID RETENTION AND ANKLE SWELLING  . levothyroxine (SYNTHROID) 50 MCG tablet TAKE ONE-HALF TABLET BY MOUTH ONCE DAILY  . lisinopril (ZESTRIL) 20 MG tablet Take 1 tablet by mouth once daily for blood  pressure  . lisinopril-hydrochlorothiazide (PRINZIDE,ZESTORETIC) 20-25 MG tablet TAKE 1 TABLET BY MOUTH ONCE DAILY  . Multiple Vitamin (MULTIVITAMIN) capsule Take 1 capsule by mouth daily.   No current facility-administered medications on file prior to visit.     Allergies:  Allergies  Allergen Reactions  . Phentermine     Hyperkalemia     Medical History:  Past Medical History:  Diagnosis Date  . Hypertension   . Hypomagnesemia   . Obesity, morbid, BMI 50 or higher ([place])   . Prediabetes   . Vitamin D deficiency    Family history- Reviewed and unchanged Social history- Reviewed and unchanged   Review of Systems:  Review of Systems  Constitutional: Negative for malaise/fatigue and weight loss.  HENT: Negative for hearing loss and tinnitus.   Eyes: Negative for blurred vision and double vision.  Respiratory: Positive for shortness of breath (With exertion; secondary to body habitus - consistent with baseline). Negative for cough and wheezing.   Cardiovascular: Negative for chest pain, palpitations, orthopnea, claudication and leg swelling.  Gastrointestinal: Negative for abdominal pain, blood in stool, constipation, diarrhea, heartburn, melena, nausea and vomiting.  Genitourinary: Negative.   Musculoskeletal: Negative for joint pain and myalgias.  Skin: Negative for rash.  Neurological: Negative for dizziness, tingling, sensory change, weakness and headaches.  Endo/Heme/Allergies: Negative for polydipsia.  Psychiatric/Behavioral: Negative.  Negative for depression and substance abuse. The patient is not nervous/anxious.   All other systems reviewed and are negative.   Physical Exam: There were no vitals taken for this visit. Wt Readings from Last 3 Encounters:  03/28/15 (!) 411 lb (186.4 kg)  04/24/14 (!) 399 lb (  181 kg)  02/08/14 (!) 394 lb (178.7 kg)   General Appearance: Well nourished well developed, morbidly obese, in no apparent distress.  Eyes: conjunctiva no  swelling or erythema ENT/Mouth: No hoarseness, No cough for duration of visit.  Neck: Supple  Respiratory: Respiratory effort normal, normal rate, no retractions or distress.   Cardio: Appears well-perfused, noncyanotic Musculoskeletal: no obvious deformity Skin: visible skin without rashes, ecchymosis, erythema Neuro: Awake and oriented X 3,  Psych:  normal affect, Insight and Judgment appropriate.   Vicie Mutters, PA-C 1:30 PM [facility]

## 2019-09-12 ENCOUNTER — Ambulatory Visit: Payer: BC Managed Care – PPO | Attending: Internal Medicine

## 2019-09-12 ENCOUNTER — Encounter: Payer: Self-pay | Admitting: Physician Assistant

## 2019-09-12 ENCOUNTER — Ambulatory Visit: Payer: BC Managed Care – PPO | Admitting: Physician Assistant

## 2019-09-12 ENCOUNTER — Other Ambulatory Visit: Payer: Self-pay

## 2019-09-12 VITALS — Temp 98.9°F

## 2019-09-12 DIAGNOSIS — J0100 Acute maxillary sinusitis, unspecified: Secondary | ICD-10-CM | POA: Diagnosis not present

## 2019-09-12 DIAGNOSIS — Z20822 Contact with and (suspected) exposure to covid-19: Secondary | ICD-10-CM | POA: Diagnosis not present

## 2019-09-12 MED ORDER — AZITHROMYCIN 250 MG PO TABS: ORAL_TABLET | ORAL | 1 refills | Status: AC

## 2019-09-12 MED ORDER — TRIAMCINOLONE ACETONIDE 55 MCG/ACT NA AERO: 2.0000 | INHALATION_SPRAY | Freq: Every day | NASAL | 3 refills | Status: DC

## 2019-09-12 NOTE — Patient Instructions (Addendum)
Can do a steroid nasal spary 1-2 sprays at night each nostril.  Examples are nasonex, flonase, nasocort- they are over the counter.  Remember to spray each nostril twice towards the outer part of your eye.   Do not sniff but instead pinch your nose and tilt your head back to help the medicine get into your sinuses.   The best time to do this is at bedtime.  Stop if you get blurred vision or nose bleeds.   THIS WILL TAKE 7 DAYS TO WORK AND IS BETTER IF YOU START BEFORE SYMPTOMS SO IF YOU HAVE A SEASON OR TIME OF THE YEAR YOU ALWAYS GET A COLD, START BEFORE THAT!  SINUSITIS  Sinusitis is inflammation or infection of your sinuses. It is most often caused by a virus. Acute sinusitis may last up to 12 weeks. Chronic sinusitis lasts longer than 12 weeks. Recurrent sinusitis means you have 4 or more times in 1 year, if this occurs we may suggest sending you to an ENT to look for a cause such as polyps or deviated septum.    Sinusitis can be uncomfortable. People with sinusitis have congestion with yellow/green/gray discharge, sinus pain/pressure, pain around the eyes. Sinus infections almost ALWAYS stem from a viral infection and antibiotics don't work against a virus. Even when bacteria is responsible, the infections usually clear up on their own in a week or so.   PLEASE TRY TO DO OVER THE COUNTER TREATMENT DISCUSSED BELOW  AND PREDNISONE  FOR INFLAMMATION IF YOU CHOSE TO TAKE THIS  IF YOU ARE NOT GETTING BETTER OR GETTING WORSE THEN YOU CAN START ON AN ANTIBIOTIC GIVEN.   Risk of antibiotic use: About 1 in 4 people who take antibiotics have side effects including stomach problems, dizziness, or rashes. Those problems clear up soon after stopping the drugs, but in rare cases antibiotics can cause severe allergic reaction. Over use of antibiotics also encourages the growth of bacteria that can't be controlled easily with drugs. That makes you more vunerable to antibiotic-resistant infections and  undermines the benefits of antibiotics for others.   Waste of Money: Antibiotics often aren't very expensive, but any money spent on unnecessary drugs is money down the drain.   When are antibiotics needed? Only when symptoms last longer than a week.  Start to improve but then worsen again  -It can take up to 2 weeks to feel better.   -If you do not get better in 7-10 days (Have fever, facial pain, dental pain and swelling), then please call the office and it is now appropriate to start an antibiotic.   MEDICATIONS TO USE FOR SINUSITIS  Acetaminophen decreases pain and fever. It is available without a doctor's order. Ask how much to take and how often to take it. Follow directions. Read the labels of all other medicines you are using to see if they also contain acetaminophen, or ask your doctor or pharmacist. Acetaminophen can cause liver damage if not taken correctly. Do not use more than 4 grams (4,000 milligrams) total of acetaminophen in one day. -Acetaminiphen 325mg  orally every 4-6 hours for pain.  Max: 10 per day  -Ibuprofen 200mg  orally every 6-8 hours for pain.  Take with food to avoid ulcers.   Max 10 per day  NSAIDs , such as ibuprofen, help decrease swelling, pain, and fever. This medicine is available with or without a doctor's order. NSAIDs can cause stomach bleeding or kidney problems in certain people. If you take blood thinner medicine,  always ask your healthcare provider if NSAIDs are safe for you. Always read the medicine label and follow directions.   Antihistamines help dry mucus in the nose and relieve sneezing.  Claritin or loratadine cheapest but likely the weakest  Zyrtec or certizine at night because it can make you sleepy The strongest is allegra or fexafinadine  Cheapest at [employer], [employer], [employer]   Decongestants help reduce swelling and drain mucus in the nose and sinuses. They may help you breathe easier. Avoid nasal spray decongestants over the counter,  these can cause you to become addicted to them.   Flonase/Nasonex is to help the inflammation and decreases pressure symptoms.    Take 2 sprays in each nostril at bedtime.  Make sure you spray towards the outside of each nostril towards the outer corner of your eye, hold nose close and tilt head back.  This will help the medication get into your sinuses.  If you do not like this medication, then use saline nasal sprays same directions as above for Flonase. Stop the medication right away if you get blurring of your vision or nose bleeds.  -While drinking fluids, pinch and hold nose close and swallow.  This will help open up your eustachian tubes to drain the fluid behind your ear drums. -Try steam showers to open your nasal passages.   Drink lots of water to stay hydrated and to thin mucous.   Go to the emergency department if:   Your eye and eyelid are red, swollen, and painful.    You cannot open your eye.    You have vision changes, such as double vision.   Your eyeball bulges out or you cannot move your eye.    You are more sleepy than normal, or you notice changes in your ability to think, move, or talk.   You have a stiff neck, a fever, or a bad headache.    You have swelling of your forehead or scalp.  Contact us if:    Your symptoms do not go away after 10 days.   You have nausea and are vomiting.   Your nose is bleeding.   You have questions or concerns about your condition or care.     Take your medicine as directed. Contact your healthcare provider if you think your medicine is not helping or if you have side effects. Tell him or her if you are allergic to any medicine. Keep a list of the medicines, vitamins, and herbs you take. Include the amounts, and when and why you take them. Bring the list or the pill bottles to follow-up visits. Carry your medicine list with you in case of an emergency.  Self-care:   Rinse your sinuses. Use a sinus rinse device to  rinse your nasal passages with a saline (salt water) solution or distilled water. Do not use tap water. This will help thin the mucus in your nose and rinse away pollen and dirt. It will also help reduce swelling so you can breathe normally. Ask your healthcare provider how often to do this.    Breathe in steam. Heat a bowl of water until you see steam. Lean over the bowl and make a tent over your head with a large towel. Breathe deeply for about 20 minutes. Be careful not to get too close to the steam or burn yourself. Do this 3 times a day. You can also breathe deeply when you take a hot shower.    Sleep with your head elevated.  Place an extra pillow under your head before you go to sleep to help your sinuses drain.    Drink liquids as directed. Ask your healthcare provider how much liquid to drink each day and which liquids are best for you. Liquids will thin the mucus in your nose and help it drain. Avoid drinks that contain alcohol or caffeine.    Do not smoke, and avoid secondhand smoke. Nicotine and other chemicals in cigarettes and cigars can make your symptoms worse. Ask your healthcare provider for information if you currently smoke and need help to quit. E-cigarettes or smokeless tobacco still contain nicotine. Talk to your healthcare provider before you use these products.  Prevent the spread of germs that cause sinusitis: Wash your hands often with soap and water. Wash your hands after you use the bathroom, change a child's diaper, or sneeze. Wash your hands before you prepare or eat food.   Follow up with your healthcare provider as directed: You may be referred to an ear, nose, and throat specialist. Write down your questions so you remember to ask them during your visits.

## 2019-09-13 LAB — NOVEL CORONAVIRUS, NAA: SARS-CoV-2, NAA: NOT DETECTED

## 2019-09-13 LAB — SARS-COV-2, NAA 2 DAY TAT

## 2019-10-16 ENCOUNTER — Ambulatory Visit (INDEPENDENT_AMBULATORY_CARE_PROVIDER_SITE_OTHER): Payer: BC Managed Care – PPO | Admitting: Family Medicine

## 2019-10-16 ENCOUNTER — Encounter (INDEPENDENT_AMBULATORY_CARE_PROVIDER_SITE_OTHER): Payer: Self-pay | Admitting: Family Medicine

## 2019-10-16 ENCOUNTER — Other Ambulatory Visit: Payer: Self-pay

## 2019-10-16 VITALS — BP 119/45 | HR 71 | Temp 98.2°F | Ht 67.0 in | Wt >= 6400 oz

## 2019-10-16 DIAGNOSIS — F3289 Other specified depressive episodes: Secondary | ICD-10-CM

## 2019-10-16 DIAGNOSIS — R7303 Prediabetes: Secondary | ICD-10-CM | POA: Diagnosis not present

## 2019-10-16 DIAGNOSIS — R5383 Other fatigue: Secondary | ICD-10-CM | POA: Diagnosis not present

## 2019-10-16 DIAGNOSIS — I10 Essential (primary) hypertension: Secondary | ICD-10-CM

## 2019-10-16 DIAGNOSIS — Z9189 Other specified personal risk factors, not elsewhere classified: Secondary | ICD-10-CM

## 2019-10-16 DIAGNOSIS — N926 Irregular menstruation, unspecified: Secondary | ICD-10-CM | POA: Diagnosis not present

## 2019-10-16 DIAGNOSIS — R0602 Shortness of breath: Secondary | ICD-10-CM

## 2019-10-16 DIAGNOSIS — Z6841 Body Mass Index (BMI) 40.0 and over, adult: Secondary | ICD-10-CM

## 2019-10-16 DIAGNOSIS — E559 Vitamin D deficiency, unspecified: Secondary | ICD-10-CM

## 2019-10-16 DIAGNOSIS — Z0289 Encounter for other administrative examinations: Secondary | ICD-10-CM

## 2019-10-16 DIAGNOSIS — E66813 Obesity, class 3: Secondary | ICD-10-CM

## 2019-10-16 NOTE — Progress Notes (Addendum)
Chief Complaint:   OBESITY Kathryn Chan (MR# [id number]) is a 34 y.o. female who presents for evaluation and treatment of obesity and related comorbidities. Current BMI is Body mass index is 73.77 kg/m. Kathryn Chan has been struggling with her weight for many years and has been unsuccessful in either losing weight, maintaining weight loss, or reaching her healthy weight goal.  Kathryn Chan is currently in the action stage of change and ready to dedicate time achieving and maintaining a healthier weight. Kathryn Chan is interested in becoming our patient and working on intensive lifestyle modifications including (but not limited to) diet and exercise for weight loss.  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  She is single and lives with her parents.  She was on phentermine at the age of 16-17 and says she probably got dehydrated (potassium was elevated).  She has irregular menses, but does not have the diagnosis of PCOS.  Dashanna provided the following food recall today:  Breakfast:  Skips. Lunch:  If packed, she will have vegetables and ranch dip, [place] sandwich, and baked chips OR drive through ([employer]) #1 sandwich meal or nuggets. Dinner:  Orpha Bur out a lot.  Meat and potatoes. Does not drink sodas.  Drinks 1 gallon of water a day!  Marnette's habits were reviewed today and are as follows: Her family eats meals together, she thinks her family will eat healthier with her, her desired weight loss is 275 pounds, she has been heavy most of her life, she started gaining weight over the last 10 years, her heaviest weight ever was her current weight, she craves carbs, fries, [employer], snack food, she snacks frequently in the evenings, she skips breakfast frequently, she is frequently drinking liquids with calories, she frequently makes poor food choices, she frequently eats larger portions than normal and she struggles with emotional eating.  Depression Screen Kathryn Chan's Food and  Mood (modified PHQ-9) score was 12.  Depression screen PHQ 2/9 10/16/2019  Decreased Interest 2  Down, Depressed, Hopeless 1  PHQ - 2 Score 3  Altered sleeping 2  Tired, decreased energy 2  Change in appetite 1  Feeling bad or failure about yourself  2  Trouble concentrating 0  Moving slowly or fidgety/restless 2  Suicidal thoughts 0  PHQ-9 Score 12  Difficult doing work/chores Not difficult at all   Subjective:   1. Other fatigue Kathryn Chan denies daytime somnolence and denies waking up still tired. Patent has a history of symptoms of snoring if not elevated. Kathryn Chan generally gets 6-8 hours of sleep per night, and states that she has generally restful sleep. Snoring is present. Apneic episodes are not present. Epworth Sleepiness Score is 1.  2. SOB (shortness of breath) on exertion Kathryn Chan notes increasing shortness of breath with exercising and seems to be worsening over time with weight gain. She notes getting out of breath sooner with activity than she used to. This has gotten worse recently. Jahna denies shortness of breath at rest or orthopnea.  3. Prediabetes Sheleen has a diagnosis of prediabetes based on her elevated HgA1c and was informed this puts her at greater risk of developing diabetes. She continues to work on diet and exercise to decrease her risk of diabetes. She denies nausea or hypoglycemia.  Lab Results  Component Value Date   HGBA1C 6.1 (H) 01/25/2018   4. Essential hypertension Review: taking medications as instructed, no medication side effects noted, no chest pain on exertion, no dyspnea on exertion, no swelling  of ankles.  She is taking lisinopril and HCTZ daily.  BP Readings from Last 3 Encounters:  10/16/19 (!) 119/45  07/26/18 128/79  01/25/18 127/77   5. Vitamin D deficiency Kathryn Chan's Vitamin D level was 9.0 on 01/25/2018. She is currently taking no vitamin D supplement. She denies nausea, vomiting or muscle weakness.  6. Irregular menses Kathryn Chan  reports having an irregular menstrual cycle.  7. Other depression, with emotional eating Kathryn Chan is struggling with emotional eating and using food for comfort to the extent that it is negatively impacting her health. She has been working on behavior modification techniques to help reduce her emotional eating and has been unsuccessful. She shows no sign of suicidal or homicidal ideations.  She feels that she eats large portions.  PHQ-9 is 12 today.  8. At risk for heart disease Yamilee is at a higher than average risk for cardiovascular disease due to obesity.   Assessment/Plan:   1. Other fatigue Anam does feel that her weight is causing her energy to be lower than it should be. Fatigue may be related to obesity, depression or many other causes. Labs will be ordered, and in the meanwhile, Nathan will focus on self care including making healthy food choices, increasing physical activity and focusing on stress reduction.  Orders - EKG 12-Lead - Anemia panel - T3 - TSH - T4, free  2. SOB (shortness of breath) on exertion Alexyia does feel that she gets out of breath more easily that she used to when she exercises. Tamiko's shortness of breath appears to be obesity related and exercise induced. She has agreed to work on weight loss and gradually increase exercise to treat her exercise induced shortness of breath. Will continue to monitor closely.  3. Prediabetes Takaya will continue to work on weight loss, exercise, and decreasing simple carbohydrates to help decrease the risk of diabetes.   Orders - Hemoglobin A1c - Insulin, random  4. Essential hypertension Kathryn Chan is working on healthy weight loss and exercise to improve blood pressure control. We will watch for signs of hypotension as she continues her lifestyle modifications.  Orders - Comprehensive metabolic panel - CBC with Differential/Platelet - Lipid Panel With LDL/HDL Ratio  5. Vitamin D deficiency Low Vitamin D level  contributes to fatigue and are associated with obesity, breast, and colon cancer.  - VITAMIN D 25 Hydroxy (Vit-D Deficiency, Fractures)  6. Irregular menses Will refer to Dr. Sumner Boast and [facility] for evaluation.  Orders - Ambulatory referral to Gynecology - Anemia panel  7. Other depression, with emotional eating Patient was referred to Dr. Mallie Mussel, our Bariatric Psychologist, for evaluation due to her elevated PHQ-9 score of 12, and significant struggles with emotional eating.  8. At risk for heart disease Kathryn Chan was given approximately 15 minutes of coronary artery disease prevention counseling today. She is 34 y.o. female and has risk factors for heart disease including obesity. We discussed intensive lifestyle modifications today with an emphasis on specific weight loss instructions and strategies.   Repetitive spaced learning was employed today to elicit superior memory formation and behavioral change.  9. Class 3 severe obesity with serious comorbidity and body mass index (BMI) greater than or equal to 70 in adult, unspecified obesity type (HCC) Kathryn Chan is currently in the action stage of change and her goal is to continue with weight loss efforts. I recommend Kathryn Chan begin the structured treatment plan as follows:  She has agreed to the Category 4 Plan +600 calories.  Exercise  goals: No exercise has been prescribed at this time.   Behavioral modification strategies: increasing lean protein intake, decreasing simple carbohydrates, increasing vegetables, increasing water intake, decreasing sodium intake and increasing high fiber foods.  She was informed of the importance of frequent follow-up visits to maximize her success with intensive lifestyle modifications for her multiple health conditions. She was informed we would discuss her lab results at her next visit unless there is a critical issue that needs to be addressed sooner. Kathryn Chan agreed to keep her next  visit at the agreed upon time to discuss these results.  Objective:   Blood pressure (!) 119/45, pulse 71, temperature 98.2 F (36.8 C), temperature source Oral, height 5\' 7"  (1.702 m), weight (!) 471 lb (213.6 kg), last menstrual period 10/02/2019, SpO2 97 %. Body mass index is 73.77 kg/m.  EKG: Normal sinus rhythm, rate 74 bpm.  Indirect Calorimeter completed today shows a VO2 of 500 and a REE of 3482.  Her calculated basal metabolic rate is 0160 thus her basal metabolic rate is better than expected.  General: Cooperative, alert, well developed, in no acute distress. HEENT: Conjunctivae and lids unremarkable. Cardiovascular: Regular rhythm.  Lungs: Normal work of breathing. Neurologic: No focal deficits.   Lab Results  Component Value Date   CREATININE 0.69 01/25/2018   BUN 10 01/25/2018   NA 136 01/25/2018   K 4.4 01/25/2018   CL 97 (L) 01/25/2018   CO2 31 01/25/2018   Lab Results  Component Value Date   ALT 33 (H) 01/25/2018   AST 32 (H) 01/25/2018   ALKPHOS 50 07/21/2016   BILITOT 0.4 01/25/2018   Lab Results  Component Value Date   HGBA1C 6.1 (H) 01/25/2018   HGBA1C 6.0 (H) 04/04/2017   HGBA1C 5.8 (H) 07/21/2016   HGBA1C 5.8 (H) 03/28/2015   HGBA1C 5.7 (H) 02/08/2014   Lab Results  Component Value Date   TSH 1.50 01/25/2018   Lab Results  Component Value Date   CHOL 119 04/04/2017   HDL 42 (L) 04/04/2017   LDLCALC 59 04/04/2017   TRIG 93 04/04/2017   CHOLHDL 2.8 04/04/2017   Lab Results  Component Value Date   WBC 10.5 01/25/2018   HGB 13.2 01/25/2018   HCT 40.6 01/25/2018   MCV 90.2 01/25/2018   PLT 340 01/25/2018   Lab Results  Component Value Date   IRON 85 03/28/2015   TIBC 355 03/28/2015   FERRITIN 155 03/28/2015   Attestation Statements:   This is the patient's first visit at [facility]. The patient's NEW PATIENT PACKET was reviewed at length. Included in the packet: current and past health history, medications,  allergies, ROS, gynecologic history (women only), surgical history, family history, social history, weight history, weight loss surgery history (for those that have had weight loss surgery), nutritional evaluation, mood and food questionnaire, PHQ9, Epworth questionnaire, sleep habits questionnaire, patient life and health improvement goals questionnaire. These will all be scanned into the patient's chart under media.   During the visit, I independently reviewed the patient's EKG, bioimpedance scale results, and indirect calorimeter results. I used this information to tailor a meal plan for the patient that will help her to lose weight and will improve her obesity-related conditions going forward. I performed a medically necessary appropriate examination and/or evaluation. I discussed the assessment and treatment plan with the patient. The patient was provided an opportunity to ask questions and all were answered. The patient agreed with the plan and demonstrated an understanding  of the instructions. Labs were ordered at this visit and will be reviewed at the next visit unless more critical results need to be addressed immediately. Clinical information was updated and documented in the EMR.   I, Water quality scientist, CMA, am acting as transcriptionist for Briscoe Deutscher, DO  I have reviewed the above documentation for accuracy and completeness, and I agree with the above. Briscoe Deutscher, DO

## 2019-10-17 ENCOUNTER — Encounter (INDEPENDENT_AMBULATORY_CARE_PROVIDER_SITE_OTHER): Payer: Self-pay | Admitting: Family Medicine

## 2019-10-17 LAB — COMPREHENSIVE METABOLIC PANEL
ALT: 29 IU/L (ref 0–32)
AST: 22 IU/L (ref 0–40)
Albumin/Globulin Ratio: 1.3 (ref 1.2–2.2)
Albumin: 3.9 g/dL (ref 3.8–4.8)
Alkaline Phosphatase: 62 IU/L (ref 48–121)
BUN/Creatinine Ratio: 19 (ref 9–23)
BUN: 14 mg/dL (ref 6–20)
Bilirubin Total: 0.2 mg/dL (ref 0.0–1.2)
CO2: 29 mmol/L (ref 20–29)
Calcium: 9.3 mg/dL (ref 8.7–10.2)
Chloride: 98 mmol/L (ref 96–106)
Creatinine, Ser: 0.75 mg/dL (ref 0.57–1.00)
GFR calc Af Amer: 123 mL/min/{1.73_m2} (ref >59)
GFR calc non Af Amer: 107 mL/min/{1.73_m2} (ref >59)
Globulin, Total: 3.1 g/dL (ref 1.5–4.5)
Glucose: 107 mg/dL — ABNORMAL HIGH (ref 65–99)
Potassium: 4.7 mmol/L (ref 3.5–5.2)
Sodium: 138 mmol/L (ref 134–144)
Total Protein: 7 g/dL (ref 6.0–8.5)

## 2019-10-17 LAB — ANEMIA PANEL
Ferritin: 110 ng/mL (ref 15–150)
Folate, Hemolysate: 358 ng/mL
Folate, RBC: 882 ng/mL (ref >498)
Hematocrit: 40.6 % (ref 34.0–46.6)
Iron Saturation: 18 % (ref 15–55)
Iron: 52 ug/dL (ref 27–159)
Retic Ct Pct: 1.9 % (ref 0.6–2.6)
Total Iron Binding Capacity: 287 ug/dL (ref 250–450)
UIBC: 235 ug/dL (ref 131–425)
Vitamin B-12: 940 pg/mL (ref 232–1245)

## 2019-10-17 LAB — LIPID PANEL WITH LDL/HDL RATIO
Cholesterol, Total: 121 mg/dL (ref 100–199)
HDL: 46 mg/dL (ref >39)
LDL Chol Calc (NIH): 58 mg/dL (ref 0–99)
LDL/HDL Ratio: 1.3 ratio (ref 0.0–3.2)
Triglycerides: 89 mg/dL (ref 0–149)
VLDL Cholesterol Cal: 17 mg/dL (ref 5–40)

## 2019-10-17 LAB — CBC WITH DIFFERENTIAL/PLATELET
Basophils Absolute: 0.1 10*3/uL (ref 0.0–0.2)
Basos: 1 %
EOS (ABSOLUTE): 0.3 10*3/uL (ref 0.0–0.4)
Eos: 3 %
Hemoglobin: 13.1 g/dL (ref 11.1–15.9)
Immature Grans (Abs): 0 10*3/uL (ref 0.0–0.1)
Immature Granulocytes: 0 %
Lymphocytes Absolute: 2.1 10*3/uL (ref 0.7–3.1)
Lymphs: 24 %
MCH: 29.7 pg (ref 26.6–33.0)
MCHC: 32.3 g/dL (ref 31.5–35.7)
MCV: 92 fL (ref 79–97)
Monocytes Absolute: 0.5 10*3/uL (ref 0.1–0.9)
Monocytes: 6 %
Neutrophils Absolute: 5.6 10*3/uL (ref 1.4–7.0)
Neutrophils: 66 %
Platelets: 292 10*3/uL (ref 150–450)
RBC: 4.41 x10E6/uL (ref 3.77–5.28)
RDW: 13.1 % (ref 11.7–15.4)
WBC: 8.4 10*3/uL (ref 3.4–10.8)

## 2019-10-17 LAB — HEMOGLOBIN A1C
Est. average glucose Bld gHb Est-mCnc: 128 mg/dL
Hgb A1c MFr Bld: 6.1 % — ABNORMAL HIGH (ref 4.8–5.6)

## 2019-10-17 LAB — T3: T3, Total: 156 ng/dL (ref 71–180)

## 2019-10-17 LAB — TSH: TSH: 1.91 u[IU]/mL (ref 0.450–4.500)

## 2019-10-17 LAB — INSULIN, RANDOM: INSULIN: 86.7 u[IU]/mL — ABNORMAL HIGH (ref 2.6–24.9)

## 2019-10-17 LAB — VITAMIN D 25 HYDROXY (VIT D DEFICIENCY, FRACTURES): Vit D, 25-Hydroxy: 34.5 ng/mL (ref 30.0–100.0)

## 2019-10-17 LAB — T4, FREE: Free T4: 1.31 ng/dL (ref 0.82–1.77)

## 2019-10-22 ENCOUNTER — Encounter: Payer: Self-pay | Admitting: Physician Assistant

## 2019-10-23 ENCOUNTER — Encounter: Payer: Self-pay | Admitting: Physician Assistant

## 2019-10-30 ENCOUNTER — Other Ambulatory Visit: Payer: Self-pay

## 2019-10-30 ENCOUNTER — Encounter (INDEPENDENT_AMBULATORY_CARE_PROVIDER_SITE_OTHER): Payer: Self-pay | Admitting: Family Medicine

## 2019-10-30 ENCOUNTER — Ambulatory Visit (INDEPENDENT_AMBULATORY_CARE_PROVIDER_SITE_OTHER): Payer: BC Managed Care – PPO | Admitting: Family Medicine

## 2019-10-30 VITALS — BP 102/59 | HR 63 | Temp 98.6°F | Ht 67.0 in | Wt >= 6400 oz

## 2019-10-30 DIAGNOSIS — Z9189 Other specified personal risk factors, not elsewhere classified: Secondary | ICD-10-CM | POA: Diagnosis not present

## 2019-10-30 DIAGNOSIS — I10 Essential (primary) hypertension: Secondary | ICD-10-CM

## 2019-10-30 DIAGNOSIS — E559 Vitamin D deficiency, unspecified: Secondary | ICD-10-CM | POA: Diagnosis not present

## 2019-10-30 DIAGNOSIS — R7303 Prediabetes: Secondary | ICD-10-CM | POA: Diagnosis not present

## 2019-10-30 DIAGNOSIS — Z6841 Body Mass Index (BMI) 40.0 and over, adult: Secondary | ICD-10-CM
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
## 2019-10-31 NOTE — Progress Notes (Signed)
Chief Complaint:   OBESITY Kathryn Chan is here to discuss her progress with her obesity treatment plan along with follow-up of her obesity related diagnoses. Kathryn Chan is on the Category 4 Plan and states she is following her eating plan approximately 75-80% of the time. Kathryn Chan states she is exercising for 0 minutes 0 times per week.  Today's visit was #: 2 Starting weight: 471 lbs Starting date: 10/16/2019 Today's weight: 462 lbs Today's date: 10/31/2019 Total lbs lost to date: 9 lbs Total lbs lost since last in-office visit: 9 lbs  Interim History:  Kathryn Chan says she is doing well.  She says she "cheated" several times, so she did not believe the scale today.  MFP reviewed.  Kathryn Chan made good choices.  Protein was generally more than 125 grams.  Subjective:   1. Prediabetes Kathryn Chan has a diagnosis of prediabetes based on her elevated HgA1c and was informed this puts her at greater risk of developing diabetes. She continues to work on diet and exercise to decrease her risk of diabetes. She denies nausea or hypoglycemia.  Lab Results  Component Value Date   HGBA1C 6.1 (H) 10/16/2019   Lab Results  Component Value Date   INSULIN 86.7 (H) 10/16/2019   2. Vitamin D deficiency Kathryn Chan's Vitamin D level was 34.5 on 10/16/2019. She is currently taking no vitamin D supplement. She denies nausea, vomiting or muscle weakness.  3. Essential hypertension Review: taking medications as instructed, no medication side effects noted, no chest pain on exertion, no dyspnea on exertion, no swelling of ankles.   BP Readings from Last 3 Encounters:  10/30/19 (!) 102/59  10/16/19 (!) 119/45  07/26/18 128/79   4. At risk for complication associated with hypotension The patient is at a higher than average risk of hypotension due to weight loss.  Assessment/Plan:   1. Prediabetes Kaeden will continue to work on weight loss, exercise, and decreasing simple carbohydrates to help decrease the risk of  diabetes.   2. Vitamin D deficiency Low Vitamin D level contributes to fatigue and are associated with obesity, breast, and colon cancer. She agrees to start to take prescription Vitamin D @50 ,000 IU every week and will follow-up for routine testing of Vitamin D, at least 2-3 times per year to avoid over-replacement.  Orders - Vitamin D, Ergocalciferol, (DRISDOL) 1.25 MG (50000 UNIT) CAPS capsule; Take 1 capsule (50,000 Units total) by mouth every 7 (seven) days.  Dispense: 4 capsule; Refill: 0  3. Essential hypertension Kathryn Chan is working on healthy weight loss and exercise to improve blood pressure control. We will watch for signs of hypotension as she continues her lifestyle modifications.  Orders - hydrochlorothiazide (HYDRODIURIL) 25 MG tablet; TAKE 1 TABLET BY MOUTH ONCE DAILY FOR BLOOD PRESSURE FLUID RETENTION AND ANKLE SWELLING  Dispense: 90 tablet; Refill: 0 - lisinopril (ZESTRIL) 20 MG tablet; Take 1 tablet by mouth once daily for blood pressure  Dispense: 90 tablet; Refill: 0  4. At risk for complication associated with hypotension Kathryn Chan was given approximately 15 minutes of education and counseling today to help avoid hypotension. We discussed risks of hypotension with weight loss and signs of hypotension such as feeling lightheaded or unsteady.  Repetitive spaced learning was employed today to elicit superior memory formation and behavioral change.  5. Class 3 severe obesity with serious comorbidity and body mass index (BMI) greater than or equal to 70 in adult, unspecified obesity type (HCC) Kathryn Chan is currently in the action stage of change. As such, her  goal is to continue with weight loss efforts. She has agreed to the Category 4 Plan.   Exercise goals: Sofa exercises.  Behavioral modification strategies: increasing lean protein intake and increasing water intake.  Kathryn Chan has agreed to follow-up with our clinic in 2-3 weeks. She was informed of the importance of frequent  follow-up visits to maximize her success with intensive lifestyle modifications for her multiple health conditions.   Objective:   Blood pressure (!) 102/59, pulse 63, temperature 98.6 F (37 C), temperature source Oral, height 5\' 7"  (1.702 m), weight (!) 462 lb (209.6 kg), last menstrual period 10/02/2019, SpO2 97 %. Body mass index is 72.36 kg/m.  General: Cooperative, alert, well developed, in no acute distress. HEENT: Conjunctivae and lids unremarkable. Cardiovascular: Regular rhythm.  Lungs: Normal work of breathing. Neurologic: No focal deficits.   Lab Results  Component Value Date   CREATININE 0.75 10/16/2019   BUN 14 10/16/2019   NA 138 10/16/2019   K 4.7 10/16/2019   CL 98 10/16/2019   CO2 29 10/16/2019   Lab Results  Component Value Date   ALT 29 10/16/2019   AST 22 10/16/2019   ALKPHOS 62 10/16/2019   BILITOT 0.2 10/16/2019   Lab Results  Component Value Date   HGBA1C 6.1 (H) 10/16/2019   HGBA1C 6.1 (H) 01/25/2018   HGBA1C 6.0 (H) 04/04/2017   HGBA1C 5.8 (H) 07/21/2016   HGBA1C 5.8 (H) 03/28/2015   Lab Results  Component Value Date   INSULIN 86.7 (H) 10/16/2019   Lab Results  Component Value Date   TSH 1.910 10/16/2019   Lab Results  Component Value Date   CHOL 121 10/16/2019   HDL 46 10/16/2019   LDLCALC 58 10/16/2019   TRIG 89 10/16/2019   CHOLHDL 2.8 04/04/2017   Lab Results  Component Value Date   WBC 8.4 10/16/2019   HGB 13.1 10/16/2019   HCT 40.6 10/16/2019   MCV 92 10/16/2019   PLT 292 10/16/2019   Lab Results  Component Value Date   IRON 52 10/16/2019   TIBC 287 10/16/2019   FERRITIN 110 10/16/2019   Attestation Statements:   Reviewed by clinician on day of visit: allergies, medications, problem list, medical history, surgical history, family history, social history, and previous encounter notes.  I, Water quality scientist, CMA, am acting as transcriptionist for Briscoe Deutscher, DO  I have reviewed the above documentation for accuracy  and completeness, and I agree with the above. Briscoe Deutscher, DO

## 2019-11-01 MED ORDER — LISINOPRIL 20 MG PO TABS: ORAL_TABLET | ORAL | 0 refills | Status: DC

## 2019-11-01 MED ORDER — HYDROCHLOROTHIAZIDE 25 MG PO TABS: ORAL_TABLET | ORAL | 0 refills | Status: DC

## 2019-11-01 MED ORDER — VITAMIN D (ERGOCALCIFEROL) 1.25 MG (50000 UNIT) PO CAPS
50000.0000 [IU] | ORAL_CAPSULE | ORAL | 0 refills | Status: DC
Start: 2019-11-01 — End: 2019-11-20

## 2019-11-14 ENCOUNTER — Telehealth (INDEPENDENT_AMBULATORY_CARE_PROVIDER_SITE_OTHER): Payer: BC Managed Care – PPO | Admitting: Psychology

## 2019-11-14 ENCOUNTER — Other Ambulatory Visit: Payer: Self-pay

## 2019-11-14 DIAGNOSIS — F5089 Other specified eating disorder: Secondary | ICD-10-CM | POA: Diagnosis not present

## 2019-11-18 ENCOUNTER — Encounter (INDEPENDENT_AMBULATORY_CARE_PROVIDER_SITE_OTHER): Payer: Self-pay | Admitting: Family Medicine

## 2019-11-20 ENCOUNTER — Encounter (INDEPENDENT_AMBULATORY_CARE_PROVIDER_SITE_OTHER): Payer: Self-pay | Admitting: Family Medicine

## 2019-11-20 ENCOUNTER — Other Ambulatory Visit: Payer: Self-pay

## 2019-11-20 ENCOUNTER — Ambulatory Visit (INDEPENDENT_AMBULATORY_CARE_PROVIDER_SITE_OTHER): Payer: BC Managed Care – PPO | Admitting: Family Medicine

## 2019-11-20 VITALS — BP 136/72 | HR 65 | Temp 98.6°F | Ht 67.0 in | Wt >= 6400 oz

## 2019-11-20 DIAGNOSIS — Z6841 Body Mass Index (BMI) 40.0 and over, adult: Secondary | ICD-10-CM

## 2019-11-20 DIAGNOSIS — I10 Essential (primary) hypertension: Secondary | ICD-10-CM | POA: Diagnosis not present

## 2019-11-20 DIAGNOSIS — E559 Vitamin D deficiency, unspecified: Secondary | ICD-10-CM | POA: Diagnosis not present

## 2019-11-20 DIAGNOSIS — E66813 Obesity, class 3: Secondary | ICD-10-CM

## 2019-11-20 DIAGNOSIS — R7303 Prediabetes: Secondary | ICD-10-CM

## 2019-11-20 DIAGNOSIS — Z9189 Other specified personal risk factors, not elsewhere classified: Secondary | ICD-10-CM | POA: Diagnosis not present

## 2019-11-20 MED ORDER — VITAMIN D (ERGOCALCIFEROL) 1.25 MG (50000 UNIT) PO CAPS: 50000.0000 [IU] | ORAL_CAPSULE | ORAL | 0 refills | Status: DC

## 2019-11-20 NOTE — Telephone Encounter (Signed)
Discussed at OV today.

## 2019-11-20 NOTE — Progress Notes (Signed)
Chief Complaint:   OBESITY Kathryn Chan is here to discuss her progress with her obesity treatment plan along with follow-up of her obesity related diagnoses. Kathryn Chan is on the Category 4 Plan and states she is following her eating plan approximately 90+% of the time. Kathryn Chan states she is doing cardio and she has increased her walking for 10 minutes 2-3 times per week.  Today's visit was #: 3 Starting weight: 471 lbs Starting date: 10/16/2019 Today's weight: 459 lbs Today's date: 11/20/2019 Total lbs lost to date: 12 lbs Total lbs lost since last in-office visit: 3 lbs  Interim History: Kathryn Chan provided the following food recall today: Breakfast:  Fairlife shake, Dannon yogurt, Fiber One cereal (1/4 cup). Lunch:  Bread, [place] sausage, eggs, tomato. Snack:  Balanced break, [place], pepperoni.  Dinner:  Limiting portions.  Subjective:   1. Vitamin D deficiency Kathryn Chan's Vitamin D level was 34.5 on 10/16/2019. She is currently taking prescription vitamin D 50,000 IU each week.   2. Essential hypertension Not optimized. Review: taking medications as instructed, no medication side effects noted, no chest pain on exertion, no dyspnea on exertion, no swelling of ankles.  BP Readings from Last 3 Encounters:  11/20/19 136/72  10/30/19 (!) 102/59  10/16/19 (!) 119/45   3. Prediabetes Not optimized. Goal is HgbA1c < 5.7 and insulin level closer to 5.   Lab Results  Component Value Date   HGBA1C 6.1 (H) 10/16/2019   Lab Results  Component Value Date   INSULIN 86.7 (H) 10/16/2019   Assessment/Plan:   1. Vitamin D deficiency Low Vitamin D level contributes to fatigue and are associated with obesity, breast, and colon cancer. She agrees to continue to take prescription Vitamin D @50 ,000 IU every week and will follow-up for routine testing of Vitamin D, at least 2-3 times per year to avoid over-replacement.  Orders - Vitamin D, Ergocalciferol, (DRISDOL) 1.25 MG (50000 UNIT) CAPS capsule;  Take 1 capsule (50,000 Units total) by mouth every 7 (seven) days.  Dispense: 4 capsule; Refill: 0  2. Essential hypertension Cliffie is working on healthy weight loss and exercise to improve blood pressure control. We will watch for signs of hypotension as she continues her lifestyle modifications.  3. Prediabetes Kathryn Chan will continue to work on weight loss, exercise, and decreasing simple carbohydrates to help decrease the risk of diabetes.  We discussed metformin/Wegovy.  Will hold for now.  4. At risk for heart disease Kathryn Chan was given approximately 15 minutes of coronary artery disease prevention counseling today. She is 34 y.o. female and has risk factors for heart disease including obesity and metabolic syndrome. We discussed intensive lifestyle modifications today with an emphasis on specific weight loss instructions and strategies.   Repetitive spaced learning was employed today to elicit superior memory formation and behavioral change.  5. Class 3 severe obesity with serious comorbidity and body mass index (BMI) greater than or equal to 70 in adult, unspecified obesity type (HCC) Kathryn Chan is currently in the action stage of change. As such, her goal is to continue with weight loss efforts. She has agreed to the Category 4 Plan.   Exercise goals: For substantial health benefits, adults should do at least 150 minutes (2 hours and 30 minutes) a week of moderate-intensity, or 75 minutes (1 hour and 15 minutes) a week of vigorous-intensity aerobic physical activity, or an equivalent combination of moderate- and vigorous-intensity aerobic activity. Aerobic activity should be performed in episodes of at least 10 minutes, and preferably, it  should be spread throughout the week.  Behavioral modification strategies: increasing lean protein intake, decreasing simple carbohydrates and increasing high fiber foods.  Kathryn Chan has agreed to follow-up with our clinic in 2-3 weeks. She was informed of the  importance of frequent follow-up visits to maximize her success with intensive lifestyle modifications for her multiple health conditions.   Objective:   Blood pressure 136/72, pulse 65, temperature 98.6 F (37 C), temperature source Oral, height 5\' 7"  (1.702 m), weight (!) 459 lb (208.2 kg), SpO2 99 %. Body mass index is 71.89 kg/m.  General: Cooperative, alert, well developed, in no acute distress. HEENT: Conjunctivae and lids unremarkable. Cardiovascular: Regular rhythm.  Lungs: Normal work of breathing. Neurologic: No focal deficits.   Lab Results  Component Value Date   CREATININE 0.75 10/16/2019   BUN 14 10/16/2019   NA 138 10/16/2019   K 4.7 10/16/2019   CL 98 10/16/2019   CO2 29 10/16/2019   Lab Results  Component Value Date   ALT 29 10/16/2019   AST 22 10/16/2019   ALKPHOS 62 10/16/2019   BILITOT 0.2 10/16/2019   Lab Results  Component Value Date   HGBA1C 6.1 (H) 10/16/2019   HGBA1C 6.1 (H) 01/25/2018   HGBA1C 6.0 (H) 04/04/2017   HGBA1C 5.8 (H) 07/21/2016   HGBA1C 5.8 (H) 03/28/2015   Lab Results  Component Value Date   INSULIN 86.7 (H) 10/16/2019   Lab Results  Component Value Date   TSH 1.910 10/16/2019   Lab Results  Component Value Date   CHOL 121 10/16/2019   HDL 46 10/16/2019   LDLCALC 58 10/16/2019   TRIG 89 10/16/2019   CHOLHDL 2.8 04/04/2017   Lab Results  Component Value Date   WBC 8.4 10/16/2019   HGB 13.1 10/16/2019   HCT 40.6 10/16/2019   MCV 92 10/16/2019   PLT 292 10/16/2019   Lab Results  Component Value Date   IRON 52 10/16/2019   TIBC 287 10/16/2019   FERRITIN 110 10/16/2019   Attestation Statements:   Reviewed by clinician on day of visit: allergies, medications, problem list, medical history, surgical history, family history, social history, and previous encounter notes.  I, Water quality scientist, CMA, am acting as transcriptionist for Briscoe Deutscher, DO  I have reviewed the above documentation for accuracy and  completeness, and I agree with the above. Briscoe Deutscher, DO

## 2019-11-22 ENCOUNTER — Encounter (INDEPENDENT_AMBULATORY_CARE_PROVIDER_SITE_OTHER): Payer: Self-pay | Admitting: Family Medicine

## 2019-11-22 NOTE — Telephone Encounter (Signed)
Please advise 

## 2019-12-14 ENCOUNTER — Ambulatory Visit: Payer: BC Managed Care – PPO | Admitting: Obstetrics and Gynecology

## 2019-12-17 ENCOUNTER — Ambulatory Visit (INDEPENDENT_AMBULATORY_CARE_PROVIDER_SITE_OTHER): Payer: BC Managed Care – PPO | Admitting: Family Medicine

## 2020-01-01 ENCOUNTER — Ambulatory Visit (INDEPENDENT_AMBULATORY_CARE_PROVIDER_SITE_OTHER): Payer: BC Managed Care – PPO | Admitting: Family Medicine

## 2020-01-10 ENCOUNTER — Ambulatory Visit (INDEPENDENT_AMBULATORY_CARE_PROVIDER_SITE_OTHER): Payer: BC Managed Care – PPO | Admitting: Family Medicine

## 2020-01-10 ENCOUNTER — Encounter (INDEPENDENT_AMBULATORY_CARE_PROVIDER_SITE_OTHER): Payer: Self-pay | Admitting: Family Medicine

## 2020-01-10 ENCOUNTER — Other Ambulatory Visit: Payer: Self-pay

## 2020-01-10 VITALS — BP 126/72 | HR 78 | Temp 98.1°F | Ht 67.0 in | Wt >= 6400 oz

## 2020-01-10 DIAGNOSIS — Z9189 Other specified personal risk factors, not elsewhere classified: Secondary | ICD-10-CM

## 2020-01-10 DIAGNOSIS — Z6841 Body Mass Index (BMI) 40.0 and over, adult: Secondary | ICD-10-CM | POA: Diagnosis not present

## 2020-01-10 DIAGNOSIS — E559 Vitamin D deficiency, unspecified: Secondary | ICD-10-CM | POA: Diagnosis not present

## 2020-01-10 DIAGNOSIS — I10 Essential (primary) hypertension: Secondary | ICD-10-CM | POA: Diagnosis not present

## 2020-01-11 MED ORDER — HYDROCHLOROTHIAZIDE 25 MG PO TABS: ORAL_TABLET | ORAL | 0 refills | Status: DC

## 2020-01-11 MED ORDER — VITAMIN D (ERGOCALCIFEROL) 1.25 MG (50000 UNIT) PO CAPS: 50000.0000 [IU] | ORAL_CAPSULE | ORAL | 0 refills | Status: DC

## 2020-01-11 MED ORDER — LISINOPRIL 20 MG PO TABS: ORAL_TABLET | ORAL | 0 refills | Status: DC

## 2020-01-15 NOTE — Progress Notes (Signed)
Chief Complaint:   OBESITY Kathryn Chan is here to discuss her progress with her obesity treatment plan along with follow-up of her obesity related diagnoses. [name] is on the Category 4 Plan and states she is following her eating plan approximately 0% of the time. [name] states she is exercising for 0 minutes 0 times per week.  Today's visit was #: 4 Starting weight: 471 lbs Starting date: 10/16/2019 Today's weight: 464 lbs Today's date: 01/10/2020 Total lbs lost to date: 7 lbs Total lbs lost since last in-office visit: 0  Interim History: Kathryn Chan says her cousin is in the hospital with COVID and may not make it. She says that her work is very stressful.  She has stopped planning and journaling, but says that she did buy walking shoes. She needs to have an appointment every 2 weeks for accountability.  Assessment/Plan:   1. Vitamin D deficiency Current vitamin D is 34.5, tested on 10/13/2019. Not at goal. Optimal goal > 50 ng/dL. There is also evidence to support a goal of >70 ng/dL in patients with cancer and heart disease. Plan: Continue Vitamin D @50 ,000 IU every week with follow-up for routine testing of Vitamin D at least 2-3 times per year to avoid over-replacement.  - Vitamin D, Ergocalciferol, (DRISDOL) 1.25 MG (50000 UNIT) CAPS capsule; Take 1 capsule (50,000 Units total) by mouth every 7 (seven) days.  Dispense: 4 capsule; Refill: 0  2. Essential hypertension At goal. Medications: lisinopril and HCTZ. We will monitor for hypotension with continued weight loss.   BP Readings from Last 3 Encounters:  01/10/20 126/72  11/20/19 136/72  10/30/19 (!) 102/59   Lab Results  Component Value Date   CREATININE 0.75 10/16/2019   - lisinopril (ZESTRIL) 20 MG tablet; Take 1 tablet by mouth once daily for blood pressure  Dispense: 90 tablet; Refill: 0 - hydrochlorothiazide (HYDRODIURIL) 25 MG tablet; TAKE 1 TABLET BY MOUTH ONCE DAILY FOR BLOOD PRESSURE FLUID RETENTION AND ANKLE SWELLING   Dispense: 90 tablet; Refill: 0  3. At increased risk of exposure to COVID-19 virus Chandra was given approximately 15 minutes of COVID prevention counseling today.  Counseling  COVID-19 is a respiratory infection that is caused by a virus. It can cause serious infections, such as pneumonia, acute respiratory distress syndrome, acute respiratory failure, or sepsis.  Wash your hands often with soap and water for 20 seconds. If soap and water are not available, use alcohol-based hand sanitizer.  Wear a face mask. Make sure your mask covers your nose and mouth.  Maintain at least 6 feet distance from others when in public.  Get help right away if  You have trouble breathing, chest pain, confusion, or other concerning symptoms.  4. Class 3 severe obesity with serious comorbidity and body mass index (BMI) greater than or equal to 70 in adult, unspecified obesity type (HCC)  Kathryn Chan is currently in the action stage of change. As such, her goal is to continue with weight loss efforts. She has agreed to the Category 4 Plan.   Exercise goals: For substantial health benefits, adults should do at least 150 minutes (2 hours and 30 minutes) a week of moderate-intensity, or 75 minutes (1 hour and 15 minutes) a week of vigorous-intensity aerobic physical activity, or an equivalent combination of moderate- and vigorous-intensity aerobic activity. Aerobic activity should be performed in episodes of at least 10 minutes, and preferably, it should be spread throughout the week.  Behavioral modification strategies: emotional eating strategies.  Kathryn Chan has  agreed to follow-up with our clinic in 2 weeks. She was informed of the importance of frequent follow-up visits to maximize her success with intensive lifestyle modifications for her multiple health conditions.   Objective:   Blood pressure 126/72, pulse 78, temperature 98.1 F (36.7 C), temperature source Oral, height 5\' 7"  (1.702 m), weight (!) 465 lb  (210.9 kg), SpO2 97 %. Body mass index is 72.83 kg/m.  General: Cooperative, alert, well developed, in no acute distress. HEENT: Conjunctivae and lids unremarkable. Cardiovascular: Regular rhythm.  Lungs: Normal work of breathing. Neurologic: No focal deficits.   Lab Results  Component Value Date   CREATININE 0.75 10/16/2019   BUN 14 10/16/2019   NA 138 10/16/2019   K 4.7 10/16/2019   CL 98 10/16/2019   CO2 29 10/16/2019   Lab Results  Component Value Date   ALT 29 10/16/2019   AST 22 10/16/2019   ALKPHOS 62 10/16/2019   BILITOT 0.2 10/16/2019   Lab Results  Component Value Date   HGBA1C 6.1 (H) 10/16/2019   HGBA1C 6.1 (H) 01/25/2018   HGBA1C 6.0 (H) 04/04/2017   HGBA1C 5.8 (H) 07/21/2016   HGBA1C 5.8 (H) 03/28/2015   Lab Results  Component Value Date   INSULIN 86.7 (H) 10/16/2019   Lab Results  Component Value Date   TSH 1.910 10/16/2019   Lab Results  Component Value Date   CHOL 121 10/16/2019   HDL 46 10/16/2019   LDLCALC 58 10/16/2019   TRIG 89 10/16/2019   CHOLHDL 2.8 04/04/2017   Lab Results  Component Value Date   WBC 8.4 10/16/2019   HGB 13.1 10/16/2019   HCT 40.6 10/16/2019   MCV 92 10/16/2019   PLT 292 10/16/2019   Lab Results  Component Value Date   IRON 52 10/16/2019   TIBC 287 10/16/2019   FERRITIN 110 10/16/2019   Attestation Statements:   Reviewed by clinician on day of visit: allergies, medications, problem list, medical history, surgical history, family history, social history, and previous encounter notes.  I, Water quality scientist, CMA, am acting as transcriptionist for Briscoe Deutscher, DO  I have reviewed the above documentation for accuracy and completeness, and I agree with the above. Briscoe Deutscher, DO

## 2020-01-24 ENCOUNTER — Ambulatory Visit (INDEPENDENT_AMBULATORY_CARE_PROVIDER_SITE_OTHER): Payer: BC Managed Care – PPO | Admitting: Family Medicine

## 2020-01-31 ENCOUNTER — Encounter: Payer: 59 | Admitting: Adult Health

## 2020-02-11 ENCOUNTER — Encounter (INDEPENDENT_AMBULATORY_CARE_PROVIDER_SITE_OTHER): Payer: Self-pay

## 2020-02-13 ENCOUNTER — Ambulatory Visit (INDEPENDENT_AMBULATORY_CARE_PROVIDER_SITE_OTHER): Payer: BC Managed Care – PPO | Admitting: Family Medicine

## 2020-02-29 ENCOUNTER — Ambulatory Visit: Payer: BC Managed Care – PPO | Admitting: Obstetrics and Gynecology

## 2020-04-17 ENCOUNTER — Encounter: Payer: 59 | Admitting: Physician Assistant

## 2020-05-01 ENCOUNTER — Encounter (INDEPENDENT_AMBULATORY_CARE_PROVIDER_SITE_OTHER): Payer: Self-pay | Admitting: Family Medicine

## 2020-05-01 ENCOUNTER — Other Ambulatory Visit (INDEPENDENT_AMBULATORY_CARE_PROVIDER_SITE_OTHER): Payer: Self-pay | Admitting: Family Medicine

## 2020-05-01 DIAGNOSIS — I10 Essential (primary) hypertension: Secondary | ICD-10-CM

## 2020-05-01 MED ORDER — LISINOPRIL 20 MG PO TABS: ORAL_TABLET | ORAL | 0 refills | Status: DC

## 2020-05-01 MED ORDER — HYDROCHLOROTHIAZIDE 25 MG PO TABS: ORAL_TABLET | ORAL | 0 refills | Status: DC

## 2020-05-01 NOTE — Telephone Encounter (Signed)
Dr.Wallace °

## 2020-05-02 ENCOUNTER — Other Ambulatory Visit: Payer: Self-pay | Admitting: Internal Medicine

## 2020-05-02 ENCOUNTER — Encounter (INDEPENDENT_AMBULATORY_CARE_PROVIDER_SITE_OTHER): Payer: BC Managed Care – PPO

## 2020-05-02 DIAGNOSIS — U071 COVID-19: Secondary | ICD-10-CM

## 2020-05-02 DIAGNOSIS — J209 Acute bronchitis, unspecified: Secondary | ICD-10-CM

## 2020-05-02 MED ORDER — PROMETHAZINE-DM 6.25-15 MG/5ML PO SYRP: 5.0000 mL | ORAL_SOLUTION | Freq: Four times a day (QID) | ORAL | 1 refills | Status: DC | PRN

## 2020-05-02 MED ORDER — DEXAMETHASONE 1 MG PO TABS: ORAL_TABLET | ORAL | 0 refills | Status: DC

## 2020-05-02 MED ORDER — ALBUTEROL SULFATE HFA 108 (90 BASE) MCG/ACT IN AERS: 2.0000 | INHALATION_SPRAY | RESPIRATORY_TRACT | 0 refills | Status: DC | PRN

## 2020-05-02 MED ORDER — CLOTRIMAZOLE-BETAMETHASONE 1-0.05 % EX CREA: TOPICAL_CREAM | CUTANEOUS | 1 refills | Status: AC

## 2020-05-02 MED ORDER — PROMETHAZINE-CODEINE 6.25-10 MG/5ML PO SYRP
5.0000 mL | ORAL_SOLUTION | Freq: Four times a day (QID) | ORAL | 0 refills | Status: DC | PRN
Start: 2020-05-02 — End: 2020-05-02

## 2020-05-02 NOTE — Telephone Encounter (Signed)
THIS ENCOUNTER IS A VIRTUAL VISIT DUE TO COVID-19 - PATIENT WAS NOT SEEN IN THE OFFICE.  PATIENT HAS CONSENTED TO VIRTUAL VISIT / TELEMEDICINE VISIT   Virtual Visit via telephone Note  I connected with Kathryn Chan on 05/02/2020 by telephone.  I verified that I am speaking with the correct person using two identifiers.    I discussed the limitations of evaluation and management by telemedicine and the availability of in person appointments. The patient expressed understanding and agreed to proceed.  History of Present Illness:   35 y.o. patient with hx of severe/morbid obesity (BMI 70), htn, prediabetes contacted office due to URI sx suspect for covid 19. They were found to be positive on rapid screen, OV was converted to telephone visit to minimize exposure in office. This patient is vaccinated for covid 19 - 2/2, fall 2021, pfizer fall  Sx began 5 days days ago with severe HA, started developing congestion, was tested at drive through location on 04/30/2020 and was just notified of positive sx. She reports in the last 2 days has persistent nasal congestion, very sore throat and cough (typically dry, starts if she has been talking too much), burning sensation in chest (attributes to cough), denies chest aching, wheezing, dyspnea.   She denies chills, has had temp up to 100, intermittent, worse at night.  Has been getting up to walk around, using mucinex, and tylenol/ibuprofen.    Medications  Current Outpatient Medications (Endocrine & Metabolic):  .  dexamethasone (DECADRON) 1 MG tablet, Take 3 tabs for 3 days, 2 tabs for 3 days 1 tab for 5 days. Take with food.  Current Outpatient Medications (Cardiovascular):  .  hydrochlorothiazide (HYDRODIURIL) 25 MG tablet, TAKE 1 TABLET BY MOUTH ONCE DAILY FOR BLOOD PRESSURE FLUID RETENTION AND ANKLE SWELLING .  lisinopril (ZESTRIL) 20 MG tablet, Take 1 tablet by mouth once daily for blood pressure  Current Outpatient Medications  (Respiratory):  .  albuterol (VENTOLIN HFA) 108 (90 Base) MCG/ACT inhaler, Inhale 2 puffs into the lungs every 4 (four) hours as needed for wheezing or shortness of breath. Please give generic or the one that insurance covers .  promethazine-dextromethorphan (PROMETHAZINE-DM) 6.25-15 MG/5ML syrup, Take 5 mLs by mouth 4 (four) times daily as needed for cough. .  promethazine-codeine (PHENERGAN WITH CODEINE) 6.25-10 MG/5ML syrup, Take 5 mLs by mouth every 6 (six) hours as needed for cough. Max: 48mL per day    Current Outpatient Medications (Other):  .  clotrimazole-betamethasone (LOTRISONE) cream, Apply to affected area 2 times daily .  Vitamin D, Ergocalciferol, (DRISDOL) 1.25 MG (50000 UNIT) CAPS capsule, Take 1 capsule (50,000 Units total) by mouth every 7 (seven) days.  Allergies:  Allergies  Allergen Reactions  . Phentermine     Hyperkalemia    Problem list She has Prediabetes; Vitamin D deficiency; Hypertension; Obesity, morbid, BMI 50 or higher ([place]); Medication management; Hypothyroid; and Elevated LFTs on their problem list.   Social History:   reports that she has never smoked. She has never used smokeless tobacco. She reports that she does not drink alcohol and does not use drugs.   Observations/Objective:  General : Well sounding patient in no apparent distress HEENT: no hoarseness, no cough for duration of visit Lungs: speaks in complete sentences, no audible wheezing, no apparent distress Neurological: alert, oriented x 3 Psychiatric: pleasant, judgement appropriate   Assessment and Plan:  COVID-19  Covid 19 positive per rapid screening test in vaccinated Currently symptoms are mild/mod Risk factors include severe  morbid obesity BMI 70+, htn Due to co morbid conditions and risk factors, patient will be referred for screening of outpatient monoclonal antibody therapy.  Regular breathing exercises, proning EC bASA daily for clot prevention unless contraindicated,  regular walking/calf exercises Take tylenol PRN temp 101+ Push hydration Sx supportive therapy Steroid taper was offered  Immune support with vitamin C, zinc, vitamin D reviewed Follow up via mychart or telephone if needed Advised patient obtain O2 monitor; present to ED if persistently <88% or with severe dyspnea, any CP, fever uncontrolled by tylenol, confusion, sudden decline Should remain in isolation until at least 10 days from onset of sx, 24-48 hours fever free without tylenol, sx such as cough are improved.  -     albuterol (VENTOLIN HFA) 108 (90 Base) MCG/ACT inhaler; Inhale 2 puffs into the lungs every 4 (four) hours as needed for wheezing or shortness of breath. Please give generic or the one that insurance covers -     promethazine-codeine (PHENERGAN WITH CODEINE) 6.25-10 MG/5ML syrup; Take 5 mLs by mouth every 6 (six) hours as needed for cough. Max: 32mL per day -     dexamethasone (DECADRON) 1 MG tablet; Take 3 tabs for 3 days, 2 tabs for 3 days 1 tab for 5 days. Take with food.   Follow Up Instructions:  I discussed the assessment and treatment plan with the patient. The patient was provided an opportunity to ask questions and all were answered. The patient agreed with the plan and demonstrated an understanding of the instructions.   The patient was advised to call back or seek an in-person evaluation if the symptoms worsen or if the condition fails to improve as anticipated.  I provided 20 minutes of non-face-to-face time during this encounter.   Izora Ribas, NP

## 2020-05-15 ENCOUNTER — Other Ambulatory Visit: Payer: Self-pay | Admitting: Adult Health

## 2020-05-15 MED ORDER — PROMETHAZINE-CODEINE 6.25-10 MG/5ML PO SYRP: 5.0000 mL | ORAL_SOLUTION | Freq: Four times a day (QID) | ORAL | 0 refills | Status: DC | PRN

## 2020-05-21 ENCOUNTER — Other Ambulatory Visit: Payer: Self-pay | Admitting: Adult Health

## 2020-05-21 ENCOUNTER — Ambulatory Visit (HOSPITAL_COMMUNITY)
Admission: RE | Admit: 2020-05-21 | Discharge: 2020-05-21 | Disposition: A | Discharge status: Home / Self Care | Payer: BC Managed Care – PPO | Source: Ambulatory Visit | Attending: Adult Health | Admitting: Adult Health

## 2020-05-21 ENCOUNTER — Other Ambulatory Visit (INDEPENDENT_AMBULATORY_CARE_PROVIDER_SITE_OTHER): Payer: Self-pay | Admitting: Family Medicine

## 2020-05-21 ENCOUNTER — Other Ambulatory Visit: Payer: Self-pay

## 2020-05-21 DIAGNOSIS — R058 Other specified cough: Secondary | ICD-10-CM | POA: Diagnosis not present

## 2020-05-21 DIAGNOSIS — U071 COVID-19: Secondary | ICD-10-CM | POA: Diagnosis not present

## 2020-05-21 DIAGNOSIS — R059 Cough, unspecified: Secondary | ICD-10-CM | POA: Diagnosis not present

## 2020-05-21 DIAGNOSIS — I10 Essential (primary) hypertension: Secondary | ICD-10-CM

## 2020-05-22 ENCOUNTER — Other Ambulatory Visit: Payer: Self-pay | Admitting: Adult Health

## 2020-05-22 DIAGNOSIS — I10 Essential (primary) hypertension: Secondary | ICD-10-CM

## 2020-05-22 MED ORDER — LISINOPRIL 20 MG PO TABS: ORAL_TABLET | ORAL | 1 refills | Status: DC

## 2020-05-22 MED ORDER — HYDROCHLOROTHIAZIDE 25 MG PO TABS: ORAL_TABLET | ORAL | 1 refills | Status: DC

## 2020-05-22 NOTE — Progress Notes (Signed)
Future Appointments  Date Time Provider Blue  10/27/2020  3:30 PM Liane Comber, NP [facility] None

## 2020-05-22 NOTE — Telephone Encounter (Signed)
Last seen by Dr. Juleen China, but has not been seen since September of 2021. Pt was instructed to f/u around 01/24/2020.

## 2020-05-28 ENCOUNTER — Other Ambulatory Visit: Payer: Self-pay | Admitting: Adult Health

## 2020-05-28 MED ORDER — BUDESONIDE-FORMOTEROL FUMARATE 80-4.5 MCG/ACT IN AERO: 2.0000 | INHALATION_SPRAY | Freq: Two times a day (BID) | RESPIRATORY_TRACT | 0 refills | Status: DC

## 2020-09-18 ENCOUNTER — Encounter (INDEPENDENT_AMBULATORY_CARE_PROVIDER_SITE_OTHER): Payer: Self-pay | Admitting: Family Medicine

## 2020-09-23 DIAGNOSIS — H04123 Dry eye syndrome of bilateral lacrimal glands: Secondary | ICD-10-CM | POA: Diagnosis not present

## 2020-10-22 ENCOUNTER — Ambulatory Visit (INDEPENDENT_AMBULATORY_CARE_PROVIDER_SITE_OTHER): Payer: BC Managed Care – PPO | Admitting: Family Medicine

## 2020-10-23 ENCOUNTER — Other Ambulatory Visit: Payer: Self-pay

## 2020-10-23 ENCOUNTER — Encounter (INDEPENDENT_AMBULATORY_CARE_PROVIDER_SITE_OTHER): Payer: Self-pay | Admitting: Family Medicine

## 2020-10-23 ENCOUNTER — Ambulatory Visit (INDEPENDENT_AMBULATORY_CARE_PROVIDER_SITE_OTHER): Payer: BC Managed Care – PPO | Admitting: Family Medicine

## 2020-10-23 VITALS — BP 114/67 | HR 68 | Temp 97.7°F | Ht 67.0 in | Wt >= 6400 oz

## 2020-10-23 DIAGNOSIS — U099 Post covid-19 condition, unspecified: Secondary | ICD-10-CM

## 2020-10-23 DIAGNOSIS — R7303 Prediabetes: Secondary | ICD-10-CM | POA: Diagnosis not present

## 2020-10-23 DIAGNOSIS — R0602 Shortness of breath: Secondary | ICD-10-CM

## 2020-10-23 DIAGNOSIS — I10 Essential (primary) hypertension: Secondary | ICD-10-CM | POA: Diagnosis not present

## 2020-10-23 DIAGNOSIS — R600 Localized edema: Secondary | ICD-10-CM

## 2020-10-23 DIAGNOSIS — F3289 Other specified depressive episodes: Secondary | ICD-10-CM

## 2020-10-23 DIAGNOSIS — Z9189 Other specified personal risk factors, not elsewhere classified: Secondary | ICD-10-CM | POA: Diagnosis not present

## 2020-10-23 DIAGNOSIS — R5383 Other fatigue: Secondary | ICD-10-CM

## 2020-10-23 DIAGNOSIS — E559 Vitamin D deficiency, unspecified: Secondary | ICD-10-CM | POA: Diagnosis not present

## 2020-10-23 DIAGNOSIS — R7301 Impaired fasting glucose: Secondary | ICD-10-CM

## 2020-10-23 DIAGNOSIS — Z6841 Body Mass Index (BMI) 40.0 and over, adult: Secondary | ICD-10-CM

## 2020-10-23 MED ORDER — MOUNJARO 2.5 MG/0.5ML ~~LOC~~ SOAJ
2.5000 mg | SUBCUTANEOUS | 0 refills | Status: DC
Start: 2020-10-23 — End: 2020-11-10

## 2020-10-23 NOTE — Telephone Encounter (Signed)
Dr.Wallace °

## 2020-10-24 LAB — CBC WITH DIFFERENTIAL/PLATELET
Basophils Absolute: 0.1 10*3/uL (ref 0.0–0.2)
Basos: 1 %
EOS (ABSOLUTE): 0.3 10*3/uL (ref 0.0–0.4)
Eos: 4 %
Hemoglobin: 13.1 g/dL (ref 11.1–15.9)
Immature Grans (Abs): 0.1 10*3/uL (ref 0.0–0.1)
Immature Granulocytes: 1 %
Lymphocytes Absolute: 2.1 10*3/uL (ref 0.7–3.1)
Lymphs: 28 %
MCH: 28.5 pg (ref 26.6–33.0)
MCHC: 30.3 g/dL — ABNORMAL LOW (ref 31.5–35.7)
MCV: 94 fL (ref 79–97)
Monocytes Absolute: 0.4 10*3/uL (ref 0.1–0.9)
Monocytes: 5 %
Neutrophils Absolute: 4.5 10*3/uL (ref 1.4–7.0)
Neutrophils: 61 %
Platelets: 294 10*3/uL (ref 150–450)
RBC: 4.6 x10E6/uL (ref 3.77–5.28)
RDW: 13.4 % (ref 11.7–15.4)
WBC: 7.3 10*3/uL (ref 3.4–10.8)

## 2020-10-24 LAB — COMPREHENSIVE METABOLIC PANEL
ALT: 29 IU/L (ref 0–32)
AST: 27 IU/L (ref 0–40)
Albumin/Globulin Ratio: 1.8 (ref 1.2–2.2)
Albumin: 4.3 g/dL (ref 3.8–4.8)
Alkaline Phosphatase: 62 IU/L (ref 44–121)
BUN/Creatinine Ratio: 17 (ref 9–23)
BUN: 11 mg/dL (ref 6–20)
Bilirubin Total: 0.3 mg/dL (ref 0.0–1.2)
CO2: 25 mmol/L (ref 20–29)
Calcium: 9.4 mg/dL (ref 8.7–10.2)
Chloride: 96 mmol/L (ref 96–106)
Creatinine, Ser: 0.66 mg/dL (ref 0.57–1.00)
Globulin, Total: 2.4 g/dL (ref 1.5–4.5)
Glucose: 121 mg/dL — ABNORMAL HIGH (ref 65–99)
Potassium: 4.4 mmol/L (ref 3.5–5.2)
Sodium: 135 mmol/L (ref 134–144)
Total Protein: 6.7 g/dL (ref 6.0–8.5)
eGFR: 119 mL/min/{1.73_m2} (ref >59)

## 2020-10-24 LAB — LIPID PANEL
Chol/HDL Ratio: 2.6 ratio (ref 0.0–4.4)
Cholesterol, Total: 120 mg/dL (ref 100–199)
HDL: 46 mg/dL (ref >39)
LDL Chol Calc (NIH): 54 mg/dL (ref 0–99)
Triglycerides: 110 mg/dL (ref 0–149)
VLDL Cholesterol Cal: 20 mg/dL (ref 5–40)

## 2020-10-24 LAB — T4, FREE: Free T4: 1.36 ng/dL (ref 0.82–1.77)

## 2020-10-24 LAB — ANEMIA PANEL
Ferritin: 94 ng/mL (ref 15–150)
Folate, Hemolysate: 448 ng/mL
Folate, RBC: 1035 ng/mL (ref >498)
Hematocrit: 43.3 % (ref 34.0–46.6)
Iron Saturation: 20 % (ref 15–55)
Iron: 65 ug/dL (ref 27–159)
Retic Ct Pct: 2.3 % (ref 0.6–2.6)
Total Iron Binding Capacity: 319 ug/dL (ref 250–450)
UIBC: 254 ug/dL (ref 131–425)
Vitamin B-12: 390 pg/mL (ref 232–1245)

## 2020-10-24 LAB — HEMOGLOBIN A1C
Est. average glucose Bld gHb Est-mCnc: 134 mg/dL
Hgb A1c MFr Bld: 6.3 % — ABNORMAL HIGH (ref 4.8–5.6)

## 2020-10-24 LAB — BRAIN NATRIURETIC PEPTIDE: BNP: <2.5 pg/mL (ref 0.0–100.0)

## 2020-10-24 LAB — MAGNESIUM: Magnesium: 1.6 mg/dL (ref 1.6–2.3)

## 2020-10-24 LAB — VITAMIN D 25 HYDROXY (VIT D DEFICIENCY, FRACTURES): Vit D, 25-Hydroxy: 25.6 ng/mL — ABNORMAL LOW (ref 30.0–100.0)

## 2020-10-24 LAB — TSH: TSH: 2.03 u[IU]/mL (ref 0.450–4.500)

## 2020-10-24 LAB — INSULIN, RANDOM: INSULIN: 93.3 u[IU]/mL — ABNORMAL HIGH (ref 2.6–24.9)

## 2020-10-27 ENCOUNTER — Encounter: Payer: Self-pay | Admitting: Adult Health

## 2020-10-27 ENCOUNTER — Telehealth (INDEPENDENT_AMBULATORY_CARE_PROVIDER_SITE_OTHER): Payer: Self-pay | Admitting: Family Medicine

## 2020-10-27 MED ORDER — POTASSIUM CHLORIDE CRYS ER 20 MEQ PO TBCR: 20.0000 meq | EXTENDED_RELEASE_TABLET | Freq: Every day | ORAL | 3 refills | Status: DC | PRN

## 2020-10-27 MED ORDER — FUROSEMIDE 20 MG PO TABS: 20.0000 mg | ORAL_TABLET | Freq: Every day | ORAL | 3 refills | Status: DC | PRN

## 2020-10-27 NOTE — Telephone Encounter (Signed)
Pt called in and stated that she was checking the status of her ne medications. I told the pt I will copy from where she sent in a message from [facility]. Please advise

## 2020-10-27 NOTE — Telephone Encounter (Signed)
Patient called to check on message regarding prior authorization for [facility].

## 2020-10-28 ENCOUNTER — Telehealth (INDEPENDENT_AMBULATORY_CARE_PROVIDER_SITE_OTHER): Payer: Self-pay

## 2020-10-28 ENCOUNTER — Encounter (INDEPENDENT_AMBULATORY_CARE_PROVIDER_SITE_OTHER): Payer: Self-pay

## 2020-10-28 NOTE — Telephone Encounter (Signed)
I have sent patient a mychart message about status of prior authorization for [facility].

## 2020-10-28 NOTE — Telephone Encounter (Signed)
Pt is calling in to check the status of the PA on [employer]. I told the pt I will send a message to the nurse. Please advise

## 2020-10-28 NOTE — Telephone Encounter (Signed)
Key #[id number]  Prior Auth for [facility] 2.5mg  was denied.  Patient is not being treated for type 2 Diabetes.Kathryn Chan

## 2020-10-29 ENCOUNTER — Telehealth (INDEPENDENT_AMBULATORY_CARE_PROVIDER_SITE_OTHER): Payer: Self-pay

## 2020-10-29 NOTE — Telephone Encounter (Signed)
Replied to pts mychart msg 

## 2020-10-29 NOTE — Telephone Encounter (Signed)
Patient is confused by the My Chart message(s) received by [facility] regarding the medication Margaretmary Eddy) that Dr. Juleen China has requested she start before her next visit.  Please refer to My Chart message and advise patient.  Thank you

## 2020-10-29 NOTE — Telephone Encounter (Signed)
Pt last seen by Dr. Wallace.  

## 2020-11-05 NOTE — Progress Notes (Signed)
Chief Complaint:   OBESITY Kathryn Chan is here to discuss her progress with her obesity treatment plan along with follow-up of her obesity related diagnoses.   Today's visit was #: 5 Starting weight: 471 lbs Starting date: 10/16/2019 Today's weight: 480 lbs Today's date: 10/24/2018 Weight change since last visit: 15 lbs Total lbs lost to date: 9 lbs Body mass index is 75.18 kg/m.   Interim History:  Kathryn Chan's goal is 12 bottles of water per day.  She urinates around 6 times in 24 hours.  She has pitting edema to the mid shin.  Will check labs today and then start her on Lasix.  She has been overeating.  She says that preplanning helps.  She endorses increased exercise intolerance and increased lower extremity edema since having COVID 5 months ago.  Current Meal Plan: the Category 4 Plan for 0% of the time.  Current Exercise Plan: None.  Assessment/Plan:   Orders Placed This Encounter  Procedures   Anemia panel   CBC with Differential/Platelet   Comprehensive metabolic panel   Hemoglobin A1c   Insulin, random   Lipid panel   VITAMIN D 25 Hydroxy (Vit-D Deficiency, Fractures)   TSH   T4, free   Magnesium   Brain natriuretic peptide   ECHOCARDIOGRAM COMPLETE   Meds ordered this encounter  Medications   tirzepatide (MOUNJARO) 2.5 MG/0.5ML Pen    Sig: Inject 2.5 mg into the skin once a week.    Dispense:  2 mL    Refill:  0   furosemide (LASIX) 20 MG tablet    Sig: Take 1 tablet (20 mg total) by mouth daily as needed.    Dispense:  30 tablet    Refill:  3   potassium chloride SA (KLOR-CON) 20 MEQ tablet    Sig: Take 1 tablet (20 mEq total) by mouth daily as needed. TAKE WITH LASIX    Dispense:  30 tablet    Refill:  3    1. Impaired fasting glucose Will check A1c and insulin level today and start Mounjaro 2.5 mg subcutaneously weekly.  - Hemoglobin A1c - Insulin, random - Start tirzepatide [facility]) 2.5 MG/0.5ML Pen; Inject 2.5 mg into the skin once a week.   Dispense: 2 mL; Refill: 0  2. SOB (shortness of breath) on exertion Kathryn Chan endorses shortness of breath on exertion along with bilateral lower extremity edema.  Will order echocardiogram today, as per below.  - ECHOCARDIOGRAM COMPLETE; Future  3. Essential hypertension At goal. Medications: HCTZ 25 mg daily.   Plan: Avoid buying foods that are: processed, frozen, or prepackaged to avoid excess salt. We will watch for signs of hypotension as she continues lifestyle modifications.  Check labs today.  BP Readings from Last 3 Encounters:  10/23/20 114/67  01/10/20 126/72  11/20/19 136/72   Lab Results  Component Value Date   CREATININE 0.66 10/23/2020   - Comprehensive metabolic panel - Lipid panel - Magnesium  4. Vitamin D deficiency Not at goal.   Plan: Will check vitamin D level today, as per below.  Lab Results  Component Value Date   VD25OH 25.6 (L) 10/23/2020   VD25OH 34.5 10/16/2019   VD25OH 9 (L) 01/25/2018   - VITAMIN D 25 Hydroxy (Vit-D Deficiency, Fractures)  5. Other fatigue Will check labs today including anemia panel, CBC, lipids, TSH, and free T4.  - Anemia panel - CBC with Differential/Platelet - Lipid panel - TSH - T4, free  6. Other depression, with emotional eating  Not at goal. Medication: None.  Plan:  Discussed cues and consequences, how thoughts affect eating, model of thoughts, feelings, and behaviors, and strategies for change by focusing on the cue. Discussed cognitive distortions, coping thoughts, and how to change your thoughts.  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  8. Bilateral lower extremity edema Will check labs today and then Kathryn Chan will start Lasix 20 mg daily as needed for lower extremity edema.  She will take potassium 20 mEq daily with the Lasix.  - Brain natriuretic peptide - Start furosemide (LASIX) 20 MG tablet;  Take 1 tablet (20 mg total) by mouth daily as needed.  Dispense: 30 tablet; Refill: 3 - Start potassium chloride SA (KLOR-CON) 20 MEQ tablet; Take 1 tablet (20 mEq total) by mouth daily as needed. TAKE WITH LASIX  Dispense: 30 tablet; Refill: 3  9. At risk for heart disease Due to Kathryn Chan's current state of health and medical condition(s), she is at a higher risk for heart disease.  This puts the patient at much greater risk to subsequently develop cardiopulmonary conditions that can significantly affect patient's quality of life in a negative manner.    At least 8 minutes were spent on counseling [name] about these concerns today. Evidence-based interventions for health behavior change were utilized today including the discussion of self monitoring techniques, problem-solving barriers, and SMART goal setting techniques.  Specifically, regarding patient's less desirable eating habits and patterns, we employed the technique of small changes when Kathryn Chan has not been able to fully commit to her prudent nutritional plan.  10. Obesity, current BMI 75.2  Course: Kathryn Chan is currently in the action stage of change. As such, her goal is to continue with weight loss efforts.   Nutrition goals: She has agreed to practicing portion control and making smarter food choices, such as increasing vegetables and decreasing simple carbohydrates and journal.   Exercise goals: No exercise has been prescribed at this time.  Behavioral modification strategies: increasing lean protein intake, decreasing simple carbohydrates, increasing vegetables, increasing water intake, and emotional eating strategies.  Kathryn Chan has agreed to follow-up with our clinic in 2 weeks. She was informed of the importance of frequent follow-up visits to maximize her success with intensive lifestyle modifications for her multiple health conditions.   Objective:   Blood pressure 114/67, pulse 68, temperature 97.7 F (36.5 C), temperature source  Oral, height 5\' 7"  (1.702 m), weight (!) 480 lb (217.7 kg), SpO2 95 %. Body mass index is 75.18 kg/m.  General: Cooperative, alert, well developed, in no acute distress. HEENT: Conjunctivae and lids unremarkable. Cardiovascular: Regular rhythm.  Lungs: Normal work of breathing. Neurologic: No focal deficits.   Lab Results  Component Value Date   CREATININE 0.66 10/23/2020   BUN 11 10/23/2020   NA 135 10/23/2020   K 4.4 10/23/2020   CL 96 10/23/2020   CO2 25 10/23/2020   Lab Results  Component Value Date   ALT 29 10/23/2020   AST 27 10/23/2020   ALKPHOS 62 10/23/2020   BILITOT 0.3 10/23/2020   Lab Results  Component Value Date   HGBA1C 6.3 (H) 10/23/2020   HGBA1C 6.1 (H) 10/16/2019   HGBA1C 6.1 (H) 01/25/2018   HGBA1C 6.0 (H) 04/04/2017   HGBA1C 5.8 (H) 07/21/2016   Lab Results  Component Value Date   INSULIN 93.3 (H) 10/23/2020   INSULIN 86.7 (H) 10/16/2019   Lab  Results  Component Value Date   TSH 2.030 10/23/2020   Lab Results  Component Value Date   CHOL 120 10/23/2020   HDL 46 10/23/2020   LDLCALC 54 10/23/2020   TRIG 110 10/23/2020   CHOLHDL 2.6 10/23/2020   Lab Results  Component Value Date   VD25OH 25.6 (L) 10/23/2020   VD25OH 34.5 10/16/2019   VD25OH 9 (L) 01/25/2018   Lab Results  Component Value Date   WBC 7.3 10/23/2020   HGB 13.1 10/23/2020   HCT 43.3 10/23/2020   MCV 94 10/23/2020   PLT 294 10/23/2020   Lab Results  Component Value Date   IRON 65 10/23/2020   TIBC 319 10/23/2020   FERRITIN 94 10/23/2020   Attestation Statements:   Reviewed by clinician on day of visit: allergies, medications, problem list, medical history, surgical history, family history, social history, and previous encounter notes.  I, Water quality scientist, CMA, am acting as transcriptionist for Briscoe Deutscher, DO  I have reviewed the above documentation for accuracy and completeness, and I agree with the above. Briscoe Deutscher, DO

## 2020-11-10 ENCOUNTER — Encounter (INDEPENDENT_AMBULATORY_CARE_PROVIDER_SITE_OTHER): Payer: Self-pay | Admitting: Family Medicine

## 2020-11-10 ENCOUNTER — Ambulatory Visit (INDEPENDENT_AMBULATORY_CARE_PROVIDER_SITE_OTHER): Payer: BC Managed Care – PPO | Admitting: Family Medicine

## 2020-11-10 ENCOUNTER — Other Ambulatory Visit: Payer: Self-pay

## 2020-11-10 VITALS — BP 125/66 | HR 64 | Temp 98.5°F | Ht 67.0 in | Wt >= 6400 oz

## 2020-11-10 DIAGNOSIS — R600 Localized edema: Secondary | ICD-10-CM

## 2020-11-10 DIAGNOSIS — Z9189 Other specified personal risk factors, not elsewhere classified: Secondary | ICD-10-CM

## 2020-11-10 DIAGNOSIS — Z6841 Body Mass Index (BMI) 40.0 and over, adult: Secondary | ICD-10-CM

## 2020-11-10 DIAGNOSIS — E559 Vitamin D deficiency, unspecified: Secondary | ICD-10-CM

## 2020-11-10 DIAGNOSIS — R7301 Impaired fasting glucose: Secondary | ICD-10-CM

## 2020-11-10 MED ORDER — VITAMIN D (ERGOCALCIFEROL) 1.25 MG (50000 UNIT) PO CAPS: 50000.0000 [IU] | ORAL_CAPSULE | ORAL | 0 refills | Status: DC

## 2020-11-10 MED ORDER — POTASSIUM CHLORIDE CRYS ER 20 MEQ PO TBCR: 20.0000 meq | EXTENDED_RELEASE_TABLET | Freq: Every day | ORAL | 1 refills | Status: DC | PRN

## 2020-11-10 MED ORDER — TIRZEPATIDE 5 MG/0.5ML ~~LOC~~ SOAJ: 5.0000 mg | SUBCUTANEOUS | 0 refills | Status: DC

## 2020-11-10 MED ORDER — FUROSEMIDE 20 MG PO TABS: 20.0000 mg | ORAL_TABLET | Freq: Every day | ORAL | 1 refills | Status: DC | PRN

## 2020-11-12 ENCOUNTER — Encounter (INDEPENDENT_AMBULATORY_CARE_PROVIDER_SITE_OTHER): Payer: Self-pay | Admitting: Family Medicine

## 2020-11-12 ENCOUNTER — Ambulatory Visit (HOSPITAL_COMMUNITY): Payer: BC Managed Care – PPO | Attending: Internal Medicine

## 2020-11-12 ENCOUNTER — Other Ambulatory Visit: Payer: Self-pay

## 2020-11-12 DIAGNOSIS — R0602 Shortness of breath: Secondary | ICD-10-CM | POA: Insufficient documentation

## 2020-11-12 LAB — ECHOCARDIOGRAM COMPLETE
Area-P 1/2: 3.65 cm2
S' Lateral: 4 cm

## 2020-11-12 MED ORDER — PERFLUTREN LIPID MICROSPHERE
1.0000 mL | INTRAVENOUS | Status: AC | PRN
Start: 2020-11-12 — End: 2020-11-12
  Administered 2020-11-12: 3 mL via INTRAVENOUS

## 2020-11-12 NOTE — Telephone Encounter (Signed)
Dr.Wallace °

## 2020-11-17 NOTE — Progress Notes (Signed)
Chief Complaint:   OBESITY Kathryn Chan is here to discuss her progress with her obesity treatment plan along with follow-up of her obesity related diagnoses.   Today's visit was #: 6 Starting weight: 471 lbs Starting date: 10/16/2019 Today's weight: 465 lbs Today's date: 11/10/2020 Weight change since last visit: 15 lbs Total lbs lost to date: 6 lbs Body mass index is 72.83 kg/m.  Total weight loss percentage to date: -1.27%  Interim History:  Kathryn Chan is very happy with her progress.  She would like to take Lasix daily.  Denies polyphagia. Current Meal Plan: the Category 4 Plan for 85-90% of the time.  Current Exercise Plan: None. Current Anti-Obesity Medications: Mounjaro 2.5 mg subcutaneously weekly. Side effects: one.  Assessment/Plan:   Meds ordered this encounter  Medications   furosemide (LASIX) 20 MG tablet    Sig: Take 1 tablet (20 mg total) by mouth daily as needed.    Dispense:  90 tablet    Refill:  1   potassium chloride SA (KLOR-CON) 20 MEQ tablet    Sig: Take 1 tablet (20 mEq total) by mouth daily as needed. TAKE WITH LASIX    Dispense:  90 tablet    Refill:  1   Vitamin D, Ergocalciferol, (DRISDOL) 1.25 MG (50000 UNIT) CAPS capsule    Sig: Take 1 capsule (50,000 Units total) by mouth every 7 (seven) days.    Dispense:  12 capsule    Refill:  0   tirzepatide (MOUNJARO) 5 MG/0.5ML Pen    Sig: Inject 5 mg into the skin once a week.    Dispense:  2 mL    Refill:  0    1. Bilateral lower extremity edema Improved.  Since Adlai feels that she does well with daily Lasix, we will discontinue HCTZ and start Lasix 20 mg daily.  Will refill Lasix and potassium today, as per below.  - Refill furosemide (LASIX) 20 MG tablet; Take 1 tablet (20 mg total) by mouth daily as needed.  Dispense: 90 tablet; Refill: 1 - Refill potassium chloride SA (KLOR-CON) 20 MEQ tablet; Take 1 tablet (20 mEq total) by mouth daily as needed. TAKE WITH LASIX  Dispense: 90 tablet; Refill:  1  2. Impaired fasting glucose Polyphagia improving. No side effects. Will increase Mounjaro to 5 mg subcutaneously weekly, as per below.  - Increase and refill tirzepatide (MOUNJARO) 5 MG/0.5ML Pen; Inject 5 mg into the skin once a week.  Dispense: 2 mL; Refill: 0  3. Vitamin D deficiency Not at goal.  She is taking vitamin D 50,000 IU weekly.  Plan: Continue to take prescription Vitamin D '@50'$ ,000 IU every week as prescribed.  Follow-up for routine testing of Vitamin D, at least 2-3 times per year to avoid over-replacement.  Lab Results  Component Value Date   VD25OH 25.6 (L) 10/23/2020   VD25OH 34.5 10/16/2019   VD25OH 9 (L) 01/25/2018   - Refill Vitamin D, Ergocalciferol, (DRISDOL) 1.25 MG (50000 UNIT) CAPS capsule; Take 1 capsule (50,000 Units total) by mouth every 7 (seven) days.  Dispense: 12 capsule; Refill: 0  4. At risk for heart disease Due to Sarit's current state of health and medical condition(s), she is at a higher risk for heart disease.  This puts the patient at much greater risk to subsequently develop cardiopulmonary conditions that can significantly affect patient's quality of life in a negative manner.    At least 8 minutes were spent on counseling Kathryn Chan about these concerns today. Evidence-based  interventions for health behavior change were utilized today including the discussion of self monitoring techniques, problem-solving barriers, and SMART goal setting techniques.  Specifically, regarding patient's less desirable eating habits and patterns, we employed the technique of small changes when Kathryn Chan has not been able to fully commit to her prudent nutritional plan.  5. Obesity, current BMI 72.9  Course: Kathryn Chan is currently in the action stage of change. As such, her goal is to continue with weight loss efforts.   Nutrition goals: She has agreed to practicing portion control and making smarter food choices, such as increasing vegetables and decreasing simple  carbohydrates.   Exercise goals:  Increase activity.  Behavioral modification strategies: increasing lean protein intake, decreasing simple carbohydrates, increasing vegetables, and increasing water intake.  Talayia has agreed to follow-up with our clinic in 3 weeks. She was informed of the importance of frequent follow-up visits to maximize her success with intensive lifestyle modifications for her multiple health conditions.   Objective:   Blood pressure 125/66, pulse 64, temperature 98.5 F (36.9 C), temperature source Oral, height '5\' 7"'$  (1.702 m), weight (!) 465 lb (210.9 kg), SpO2 100 %. Body mass index is 72.83 kg/m.  General: Cooperative, alert, well developed, in no acute distress. HEENT: Conjunctivae and lids unremarkable. Cardiovascular: Regular rhythm.  Lungs: Normal work of breathing. Neurologic: No focal deficits.   Lab Results  Component Value Date   CREATININE 0.66 10/23/2020   BUN 11 10/23/2020   NA 135 10/23/2020   K 4.4 10/23/2020   CL 96 10/23/2020   CO2 25 10/23/2020   Lab Results  Component Value Date   ALT 29 10/23/2020   AST 27 10/23/2020   ALKPHOS 62 10/23/2020   BILITOT 0.3 10/23/2020   Lab Results  Component Value Date   HGBA1C 6.3 (H) 10/23/2020   HGBA1C 6.1 (H) 10/16/2019   HGBA1C 6.1 (H) 01/25/2018   HGBA1C 6.0 (H) 04/04/2017   HGBA1C 5.8 (H) 07/21/2016   Lab Results  Component Value Date   INSULIN 93.3 (H) 10/23/2020   INSULIN 86.7 (H) 10/16/2019   Lab Results  Component Value Date   TSH 2.030 10/23/2020   Lab Results  Component Value Date   CHOL 120 10/23/2020   HDL 46 10/23/2020   LDLCALC 54 10/23/2020   TRIG 110 10/23/2020   CHOLHDL 2.6 10/23/2020   Lab Results  Component Value Date   VD25OH 25.6 (L) 10/23/2020   VD25OH 34.5 10/16/2019   VD25OH 9 (L) 01/25/2018   Lab Results  Component Value Date   WBC 7.3 10/23/2020   HGB 13.1 10/23/2020   HCT 43.3 10/23/2020   MCV 94 10/23/2020   PLT 294 10/23/2020   Lab  Results  Component Value Date   IRON 65 10/23/2020   TIBC 319 10/23/2020   FERRITIN 94 10/23/2020   Attestation Statements:   Reviewed by clinician on day of visit: allergies, medications, problem list, medical history, surgical history, family history, social history, and previous encounter notes.  I, Water quality scientist, CMA, am acting as transcriptionist for Briscoe Deutscher, DO  I have reviewed the above documentation for accuracy and completeness, and I agree with the above. Briscoe Deutscher, DO

## 2020-11-24 ENCOUNTER — Other Ambulatory Visit: Payer: Self-pay

## 2020-11-24 ENCOUNTER — Encounter (INDEPENDENT_AMBULATORY_CARE_PROVIDER_SITE_OTHER): Payer: Self-pay | Admitting: Family Medicine

## 2020-11-24 ENCOUNTER — Ambulatory Visit (INDEPENDENT_AMBULATORY_CARE_PROVIDER_SITE_OTHER): Payer: BC Managed Care – PPO | Admitting: Family Medicine

## 2020-11-24 VITALS — BP 128/72 | HR 56 | Temp 98.0°F | Ht 67.0 in | Wt >= 6400 oz

## 2020-11-24 DIAGNOSIS — Z9189 Other specified personal risk factors, not elsewhere classified: Secondary | ICD-10-CM

## 2020-11-24 DIAGNOSIS — R600 Localized edema: Secondary | ICD-10-CM | POA: Diagnosis not present

## 2020-11-24 DIAGNOSIS — Z6841 Body Mass Index (BMI) 40.0 and over, adult: Secondary | ICD-10-CM

## 2020-11-24 DIAGNOSIS — E559 Vitamin D deficiency, unspecified: Secondary | ICD-10-CM

## 2020-11-24 DIAGNOSIS — R7301 Impaired fasting glucose: Secondary | ICD-10-CM | POA: Diagnosis not present

## 2020-11-25 MED ORDER — TIRZEPATIDE 7.5 MG/0.5ML ~~LOC~~ SOAJ: 7.5000 mg | SUBCUTANEOUS | 0 refills | Status: DC

## 2020-11-26 NOTE — Progress Notes (Signed)
Chief Complaint:   OBESITY Kathryn Chan is here to discuss her progress with her obesity treatment plan along with follow-up of her obesity related diagnoses.   Today's visit was #: 7 Starting weight: 471 lbs Starting date: 10/16/2019 Today's weight: 457 lbs Today's date: 11/24/2020 Weight change since last visit: 8 lbs Total lbs lost to date: 14 lbs Body mass index is 71.58 kg/m.  Total weight loss percentage to date: -2.97%  Interim History:  Tawsha restarted taking psyllium.  She says she is still finding great benefits with Lasix and Mounjaro.  She says she has been moving more.  Current Meal Plan: practicing portion control and making smarter food choices, such as increasing vegetables and decreasing simple carbohydrates for 90-95% of the time.  Current Exercise Plan: Increased activity. Current Anti-Obesity Medications: Mounjaro 5 mg subcutaneously weekly. Side effects: None.  Assessment/Plan:   1. Impaired fasting glucose with polyphagia Improving, but not optimized. Current treatment: Mounjaro 5 mg subcutaneously weekly. She will continue to focus on protein-rich, low simple carbohydrate foods. We reviewed the importance of hydration, regular exercise for stress reduction, and restorative sleep.  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  Plan:  Increase Mounjaro to 7.5 mg subcutaneously weekly, as per below.  - Increase and refill tirzepatide (MOUNJARO) 7.5 MG/0.5ML Pen; Inject 7.5 mg into the skin once a week.  Dispense: 2 mL; Refill: 0  2. Bilateral lower extremity edema Continue Lasix 20 mg daily as needed for edema.  3. Vitamin D deficiency Not at goal.  She is taking vitamin D 50,000 IU weekly.  Plan: Continue to take prescription Vitamin D '@50'$ ,000 IU every week as prescribed.  Follow-up for routine testing of Vitamin D, at least 2-3 times per year to avoid over-replacement.  Lab Results  Component Value Date   VD25OH 25.6 (L) 10/23/2020   VD25OH 34.5  10/16/2019   VD25OH 9 (L) 01/25/2018   4. At risk for heart disease Due to Melina's current state of health and medical condition(s), she is at a higher risk for heart disease.  This puts the patient at much greater risk to subsequently develop cardiopulmonary conditions that can significantly affect patient's quality of life in a negative manner.    At least 8 minutes were spent on counseling Sharn about these concerns today. Evidence-based interventions for health behavior change were utilized today including the discussion of self monitoring techniques, problem-solving barriers, and SMART goal setting techniques.  Specifically, regarding patient's less desirable eating habits and patterns, we employed the technique of small changes when Arriyanna has not been able to fully commit to her prudent nutritional plan.  5. Obesity, current BMI 71.6  Course: Breeona is currently in the action stage of change. As such, her goal is to continue with weight loss efforts.   Nutrition goals: She has agreed to practicing portion control and making smarter food choices, such as increasing vegetables and decreasing simple carbohydrates.  Focus on protein.  Exercise goals:  As is.  Behavioral modification strategies: increasing lean protein intake, decreasing simple carbohydrates, increasing vegetables, and increasing water intake.  Lulah has agreed to follow-up with our clinic in 3-4 weeks. She was informed of the importance of frequent follow-up visits to maximize her success with intensive lifestyle modifications for her multiple health conditions.   Objective:   Blood pressure 128/72, pulse (!) 56, temperature 98 F (36.7 C), temperature source Oral, height '5\' 7"'$  (1.702 m), weight (!) 457 lb (207.3 kg), SpO2 95 %.  Body mass index is 71.58 kg/m.  General: Cooperative, alert, well developed, in no acute distress. HEENT: Conjunctivae and lids unremarkable. Cardiovascular: Regular rhythm.  Lungs: Normal  work of breathing. Neurologic: No focal deficits.   Lab Results  Component Value Date   CREATININE 0.66 10/23/2020   BUN 11 10/23/2020   NA 135 10/23/2020   K 4.4 10/23/2020   CL 96 10/23/2020   CO2 25 10/23/2020   Lab Results  Component Value Date   ALT 29 10/23/2020   AST 27 10/23/2020   ALKPHOS 62 10/23/2020   BILITOT 0.3 10/23/2020   Lab Results  Component Value Date   HGBA1C 6.3 (H) 10/23/2020   HGBA1C 6.1 (H) 10/16/2019   HGBA1C 6.1 (H) 01/25/2018   HGBA1C 6.0 (H) 04/04/2017   HGBA1C 5.8 (H) 07/21/2016   Lab Results  Component Value Date   INSULIN 93.3 (H) 10/23/2020   INSULIN 86.7 (H) 10/16/2019   Lab Results  Component Value Date   TSH 2.030 10/23/2020   Lab Results  Component Value Date   CHOL 120 10/23/2020   HDL 46 10/23/2020   LDLCALC 54 10/23/2020   TRIG 110 10/23/2020   CHOLHDL 2.6 10/23/2020   Lab Results  Component Value Date   VD25OH 25.6 (L) 10/23/2020   VD25OH 34.5 10/16/2019   VD25OH 9 (L) 01/25/2018   Lab Results  Component Value Date   WBC 7.3 10/23/2020   HGB 13.1 10/23/2020   HCT 43.3 10/23/2020   MCV 94 10/23/2020   PLT 294 10/23/2020   Lab Results  Component Value Date   IRON 65 10/23/2020   TIBC 319 10/23/2020   FERRITIN 94 10/23/2020   Attestation Statements:   Reviewed by clinician on day of visit: allergies, medications, problem list, medical history, surgical history, family history, social history, and previous encounter notes.  I, Water quality scientist, CMA, am acting as transcriptionist for Briscoe Deutscher, DO  I have reviewed the above documentation for accuracy and completeness, and I agree with the above. Briscoe Deutscher, DO

## 2020-12-02 ENCOUNTER — Encounter (INDEPENDENT_AMBULATORY_CARE_PROVIDER_SITE_OTHER): Payer: Self-pay | Admitting: Family Medicine

## 2020-12-02 ENCOUNTER — Other Ambulatory Visit: Payer: Self-pay | Admitting: Adult Health

## 2020-12-02 DIAGNOSIS — I10 Essential (primary) hypertension: Secondary | ICD-10-CM

## 2020-12-02 MED ORDER — LISINOPRIL 20 MG PO TABS
ORAL_TABLET | ORAL | 0 refills | Status: DC
Start: 2020-12-02 — End: 2021-03-07

## 2020-12-02 NOTE — Telephone Encounter (Signed)
Last OV with Dr Wallace 

## 2020-12-16 ENCOUNTER — Ambulatory Visit (INDEPENDENT_AMBULATORY_CARE_PROVIDER_SITE_OTHER): Payer: BC Managed Care – PPO | Admitting: Family Medicine

## 2020-12-16 ENCOUNTER — Encounter (INDEPENDENT_AMBULATORY_CARE_PROVIDER_SITE_OTHER): Payer: Self-pay | Admitting: Family Medicine

## 2020-12-16 ENCOUNTER — Other Ambulatory Visit: Payer: Self-pay

## 2020-12-16 VITALS — BP 114/66 | HR 65 | Temp 98.4°F | Ht 67.0 in | Wt >= 6400 oz

## 2020-12-16 DIAGNOSIS — R632 Polyphagia: Secondary | ICD-10-CM | POA: Diagnosis not present

## 2020-12-16 DIAGNOSIS — F3289 Other specified depressive episodes: Secondary | ICD-10-CM

## 2020-12-16 DIAGNOSIS — I10 Essential (primary) hypertension: Secondary | ICD-10-CM | POA: Diagnosis not present

## 2020-12-16 DIAGNOSIS — R600 Localized edema: Secondary | ICD-10-CM

## 2020-12-16 DIAGNOSIS — Z9189 Other specified personal risk factors, not elsewhere classified: Secondary | ICD-10-CM | POA: Diagnosis not present

## 2020-12-16 DIAGNOSIS — Z6841 Body Mass Index (BMI) 40.0 and over, adult: Secondary | ICD-10-CM

## 2020-12-17 NOTE — Progress Notes (Signed)
Chief Complaint:   OBESITY Kathryn Chan is here to discuss her progress with her obesity treatment plan along with follow-up of her obesity related diagnoses.   Today's visit was #: 8 Starting weight: 471 lbs Starting date: 10/16/2019 Today's weight: 443 lbs Today's date: 12/16/2020 Weight change since last visit: 4 lbs Total lbs lost to date: 28 lbs Body mass index is 69.38 kg/m.  Total weight loss percentage to date: -5.94%  Current Meal Plan: the Category 4 Plan for 90% of the time.  Current Exercise Plan: Increased walking/chair workouts for 10 minutes 1-2 times per week. Current Anti-Obesity Medications: Mounjaro 5 mg subcutaneously weekly. Side effects: None.  Interim History:  Kathryn Chan is still on Mounjaro 5 mg weekly.  She will start 7.5 mg next week.  For breakfast, she is having [employer] salted caramel shakes and coffee and collagen.  She reports a strong family history of cardiovascular disease and diabetes type 2.  Assessment/Plan:   1. Essential hypertension At goal. Medications: Lasix 20 mg daily, lisinopril 20 mg daily, potassium 20 mEq daily.   Plan: Avoid buying foods that are: processed, frozen, or prepackaged to avoid excess salt. We will watch for signs of hypotension as she continues lifestyle modifications.  BP Readings from Last 3 Encounters:  12/16/20 114/66  11/24/20 128/72  11/10/20 125/66   Lab Results  Component Value Date   CREATININE 0.66 10/23/2020   2. Polyphagia Not optimized. Current treatment: Mounjaro 5 mg subcutaneously weekly. She will continue to focus on protein-rich, low simple carbohydrate foods. We reviewed the importance of hydration, regular exercise for stress reduction, and restorative sleep.  Plan:  She will increase her Mounjaro dose to 7.5 mg subcutaneously weekly next week.  3. Bilateral lower extremity edema Kathryn Chan is taking Lasix 20 mg daily along with potassium 20 mEq daily.  The current medical regimen is effective;   continue present plan and medications.  4. Emotional eating tendencies Not at goal. Medication: None.  Plan:  Discussed cues and consequences, how thoughts affect eating, model of thoughts, feelings, and behaviors, and strategies for change by focusing on the cue. Discussed cognitive distortions, coping thoughts, and how to change your thoughts.  5. At risk for heart disease Due to Kathryn Chan's current state of health and medical condition(s), she is at a higher risk for heart disease.  This puts the patient at much greater risk to subsequently develop cardiopulmonary conditions that can significantly affect patient's quality of life in a negative manner.    At least 8 minutes were spent on counseling Kathryn Chan about these concerns today. Evidence-based interventions for health behavior change were utilized today including the discussion of self monitoring techniques, problem-solving barriers, and SMART goal setting techniques.  Specifically, regarding patient's less desirable eating habits and patterns, we employed the technique of small changes when Kathryn Chan has not been able to fully commit to her prudent nutritional plan.  6. Obesity, current BMI 69.5  Course: Kathryn Chan is currently in the action stage of change. As such, her goal is to continue with weight loss efforts.   Nutrition goals: She has agreed to practicing portion control and making smarter food choices, such as increasing vegetables and decreasing simple carbohydrates.  Bariatric plate reviewed.  Exercise goals:  As is.  Behavioral modification strategies: increasing lean protein intake, decreasing simple carbohydrates, increasing vegetables, and increasing water intake.    Kathryn Chan has agreed to follow-up with our clinic in 4 weeks. She was informed of the importance of frequent follow-up  visits to maximize her success with intensive lifestyle modifications for her multiple health conditions.   Objective:   Blood pressure 114/66, pulse  65, temperature 98.4 F (36.9 C), temperature source Oral, height '5\' 7"'$  (1.702 m), weight (!) 443 lb (200.9 kg), SpO2 97 %. Body mass index is 69.38 kg/m.  General: Cooperative, alert, well developed, in no acute distress. HEENT: Conjunctivae and lids unremarkable. Cardiovascular: Regular rhythm.  Lungs: Normal work of breathing. Neurologic: No focal deficits.   Lab Results  Component Value Date   CREATININE 0.66 10/23/2020   BUN 11 10/23/2020   NA 135 10/23/2020   K 4.4 10/23/2020   CL 96 10/23/2020   CO2 25 10/23/2020   Lab Results  Component Value Date   ALT 29 10/23/2020   AST 27 10/23/2020   ALKPHOS 62 10/23/2020   BILITOT 0.3 10/23/2020   Lab Results  Component Value Date   HGBA1C 6.3 (H) 10/23/2020   HGBA1C 6.1 (H) 10/16/2019   HGBA1C 6.1 (H) 01/25/2018   HGBA1C 6.0 (H) 04/04/2017   HGBA1C 5.8 (H) 07/21/2016   Lab Results  Component Value Date   INSULIN 93.3 (H) 10/23/2020   INSULIN 86.7 (H) 10/16/2019   Lab Results  Component Value Date   TSH 2.030 10/23/2020   Lab Results  Component Value Date   CHOL 120 10/23/2020   HDL 46 10/23/2020   LDLCALC 54 10/23/2020   TRIG 110 10/23/2020   CHOLHDL 2.6 10/23/2020   Lab Results  Component Value Date   VD25OH 25.6 (L) 10/23/2020   VD25OH 34.5 10/16/2019   VD25OH 9 (L) 01/25/2018   Lab Results  Component Value Date   WBC 7.3 10/23/2020   HGB 13.1 10/23/2020   HCT 43.3 10/23/2020   MCV 94 10/23/2020   PLT 294 10/23/2020   Lab Results  Component Value Date   IRON 65 10/23/2020   TIBC 319 10/23/2020   FERRITIN 94 10/23/2020   Attestation Statements:   Reviewed by clinician on day of visit: allergies, medications, problem list, medical history, surgical history, family history, social history, and previous encounter notes.  I, Water quality scientist, CMA, am acting as transcriptionist for Briscoe Deutscher, DO  I have reviewed the above documentation for accuracy and completeness, and I agree with the above. Briscoe Deutscher, DO

## 2021-01-01 ENCOUNTER — Other Ambulatory Visit: Payer: Self-pay

## 2021-01-01 ENCOUNTER — Ambulatory Visit (INDEPENDENT_AMBULATORY_CARE_PROVIDER_SITE_OTHER): Payer: BC Managed Care – PPO | Admitting: Family Medicine

## 2021-01-01 ENCOUNTER — Encounter (INDEPENDENT_AMBULATORY_CARE_PROVIDER_SITE_OTHER): Payer: Self-pay | Admitting: Family Medicine

## 2021-01-01 VITALS — BP 113/66 | HR 61 | Temp 97.8°F | Ht 67.0 in | Wt >= 6400 oz

## 2021-01-01 DIAGNOSIS — R7301 Impaired fasting glucose: Secondary | ICD-10-CM | POA: Diagnosis not present

## 2021-01-01 DIAGNOSIS — E559 Vitamin D deficiency, unspecified: Secondary | ICD-10-CM

## 2021-01-01 DIAGNOSIS — Z9189 Other specified personal risk factors, not elsewhere classified: Secondary | ICD-10-CM

## 2021-01-01 DIAGNOSIS — R600 Localized edema: Secondary | ICD-10-CM

## 2021-01-01 DIAGNOSIS — Z6841 Body Mass Index (BMI) 40.0 and over, adult: Secondary | ICD-10-CM

## 2021-01-01 DIAGNOSIS — E66813 Obesity, class 3: Secondary | ICD-10-CM

## 2021-01-01 MED ORDER — TIRZEPATIDE 10 MG/0.5ML ~~LOC~~ SOAJ: 10.0000 mg | SUBCUTANEOUS | 0 refills | Status: DC

## 2021-01-02 NOTE — Progress Notes (Signed)
Chief Complaint:   OBESITY Kathryn Chan is here to discuss her progress with her obesity treatment plan along with follow-up of her obesity related diagnoses.   Today's visit was #: 9 Starting weight: 471 lbs Starting date: 10/16/2019 Today's weight: 432 lbs Today's date: 01/01/2021 Weight change since last visit: 1 lb Total lbs lost to date: 39 lbs Body mass index is 67.66 kg/m.  Total weight loss percentage to date: -8.28%  Current Meal Plan: the Category 4 Plan for 90% of the time.  Current Exercise Plan: Chair exercises/strength training for 10-15 minutes 3 times per week. Current Anti-Obesity Medications: Mounjaro 7.5 mg subcutaneously weekly. Side effects: None.  Interim History:  Kathryn Chan continues to do very well.  She is making great food choices, more active, more motivated and happy with progress.  She is having no medication side effects.  Assessment/Plan:   1. Impaired fasting glucose Increase Mounjaro to 10 mg subcutaneously weekly, as per below.  - Increase and refill tirzepatide (MOUNJARO) 10 MG/0.5ML Pen; Inject 10 mg into the skin once a week.  Dispense: 6 mL; Refill: 0  2. Bilateral lower extremity edema Kathryn Chan is taking Lasix 20 mg daily and potassium 20 mEq daily.  Plan:  Continue Lasix and potassium.  3. Vitamin D deficiency Not at goal.  She is taking vitamin D 50,000 IU weekly.  Plan: Continue to take prescription Vitamin D '@50'$ ,000 IU every week as prescribed.  Follow-up for routine testing of Vitamin D, at least 2-3 times per year to avoid over-replacement.  Lab Results  Component Value Date   VD25OH 25.6 (L) 10/23/2020   VD25OH 34.5 10/16/2019   VD25OH 9 (L) 01/25/2018   4. At risk for heart disease Due to Kathryn Chan's current state of health and medical condition(s), she is at a higher risk for heart disease.  This puts the patient at much greater risk to subsequently develop cardiopulmonary conditions that can significantly affect patient's quality  of life in a negative manner.    At least 8 minutes were spent on counseling Kathryn Chan about these concerns today. Evidence-based interventions for health behavior change were utilized today including the discussion of self monitoring techniques, problem-solving barriers, and SMART goal setting techniques.  Specifically, regarding patient's less desirable eating habits and patterns, we employed the technique of small changes when Kathryn Chan has not been able to fully commit to her prudent nutritional plan.  5. Obesity, current BMI 67.7  Course: Kathryn Chan is currently in the action stage of change. As such, her goal is to continue with weight loss efforts.   Nutrition goals: She has agreed to the Category 4 Plan.   Exercise goals:  As is.  Behavioral modification strategies: increasing lean protein intake, decreasing simple carbohydrates, increasing vegetables, and increasing water intake.  Kathryn Chan has agreed to follow-up with our clinic in 3-4 weeks. She was informed of the importance of frequent follow-up visits to maximize her success with intensive lifestyle modifications for her multiple health conditions.   Objective:   Blood pressure 113/66, pulse 61, temperature 97.8 F (36.6 C), temperature source Oral, height '5\' 7"'$  (1.702 m), weight (!) 432 lb (196 kg), SpO2 97 %. Body mass index is 67.66 kg/m.  General: Cooperative, alert, well developed, in no acute distress. HEENT: Conjunctivae and lids unremarkable. Cardiovascular: Regular rhythm.  Lungs: Normal work of breathing. Neurologic: No focal deficits.   Lab Results  Component Value Date   CREATININE 0.66 10/23/2020   BUN 11 10/23/2020   NA 135 10/23/2020  K 4.4 10/23/2020   CL 96 10/23/2020   CO2 25 10/23/2020   Lab Results  Component Value Date   ALT 29 10/23/2020   AST 27 10/23/2020   ALKPHOS 62 10/23/2020   BILITOT 0.3 10/23/2020   Lab Results  Component Value Date   HGBA1C 6.3 (H) 10/23/2020   HGBA1C 6.1 (H) 10/16/2019    HGBA1C 6.1 (H) 01/25/2018   HGBA1C 6.0 (H) 04/04/2017   HGBA1C 5.8 (H) 07/21/2016   Lab Results  Component Value Date   INSULIN 93.3 (H) 10/23/2020   INSULIN 86.7 (H) 10/16/2019   Lab Results  Component Value Date   TSH 2.030 10/23/2020   Lab Results  Component Value Date   CHOL 120 10/23/2020   HDL 46 10/23/2020   LDLCALC 54 10/23/2020   TRIG 110 10/23/2020   CHOLHDL 2.6 10/23/2020   Lab Results  Component Value Date   VD25OH 25.6 (L) 10/23/2020   VD25OH 34.5 10/16/2019   VD25OH 9 (L) 01/25/2018   Lab Results  Component Value Date   WBC 7.3 10/23/2020   HGB 13.1 10/23/2020   HCT 43.3 10/23/2020   MCV 94 10/23/2020   PLT 294 10/23/2020   Lab Results  Component Value Date   IRON 65 10/23/2020   TIBC 319 10/23/2020   FERRITIN 94 10/23/2020   Attestation Statements:   Reviewed by clinician on day of visit: allergies, medications, problem list, medical history, surgical history, family history, social history, and previous encounter notes.  I, Water quality scientist, CMA, am acting as transcriptionist for Briscoe Deutscher, DO  I have reviewed the above documentation for accuracy and completeness, and I agree with the above. Briscoe Deutscher, DO

## 2021-01-06 ENCOUNTER — Telehealth (INDEPENDENT_AMBULATORY_CARE_PROVIDER_SITE_OTHER): Payer: Self-pay

## 2021-01-06 DIAGNOSIS — R7301 Impaired fasting glucose: Secondary | ICD-10-CM

## 2021-01-06 MED ORDER — TIRZEPATIDE 10 MG/0.5ML ~~LOC~~ SOAJ
10.0000 mg | SUBCUTANEOUS | 0 refills | Status: DC
Start: 2021-01-06 — End: 2021-01-19

## 2021-01-06 NOTE — Telephone Encounter (Signed)
Pt called in and stated that the pharmacy stated that for her refill on her Kathryn Chan was only for 6 injections. The pt states that she was told it will be a 3 month supply and it should be 12 injections. The pt is requesting some clarification. Please advise.

## 2021-01-06 NOTE — Telephone Encounter (Signed)
LM notifying the pt that the prescription was written for 6 ml (12 pens) not 6 pens and informed pt to contact pharmacy and have them recheck the prescription.

## 2021-01-06 NOTE — Telephone Encounter (Signed)
RX resent

## 2021-01-07 ENCOUNTER — Encounter (INDEPENDENT_AMBULATORY_CARE_PROVIDER_SITE_OTHER): Payer: Self-pay

## 2021-01-19 ENCOUNTER — Encounter (INDEPENDENT_AMBULATORY_CARE_PROVIDER_SITE_OTHER): Payer: Self-pay | Admitting: Family Medicine

## 2021-01-19 ENCOUNTER — Other Ambulatory Visit: Payer: Self-pay

## 2021-01-19 ENCOUNTER — Ambulatory Visit (INDEPENDENT_AMBULATORY_CARE_PROVIDER_SITE_OTHER): Payer: BC Managed Care – PPO | Admitting: Family Medicine

## 2021-01-19 VITALS — BP 123/65 | HR 59 | Temp 98.0°F | Ht 67.0 in | Wt >= 6400 oz

## 2021-01-19 DIAGNOSIS — R7301 Impaired fasting glucose: Secondary | ICD-10-CM | POA: Diagnosis not present

## 2021-01-19 DIAGNOSIS — Z6841 Body Mass Index (BMI) 40.0 and over, adult: Secondary | ICD-10-CM

## 2021-01-19 DIAGNOSIS — E559 Vitamin D deficiency, unspecified: Secondary | ICD-10-CM

## 2021-01-19 DIAGNOSIS — Z9189 Other specified personal risk factors, not elsewhere classified: Secondary | ICD-10-CM | POA: Diagnosis not present

## 2021-01-19 MED ORDER — VITAMIN D (ERGOCALCIFEROL) 1.25 MG (50000 UNIT) PO CAPS: 50000.0000 [IU] | ORAL_CAPSULE | ORAL | 0 refills | Status: DC

## 2021-01-19 MED ORDER — TIRZEPATIDE 10 MG/0.5ML ~~LOC~~ SOAJ: 10.0000 mg | SUBCUTANEOUS | 0 refills | Status: DC

## 2021-01-19 NOTE — Progress Notes (Signed)
Chief Complaint:   OBESITY Kathryn Chan is here to discuss her progress with her obesity treatment plan along with follow-up of her obesity related diagnoses. See Medical Weight Management Flowsheet for complete bioelectrical impedance results.  Today's visit was #: 10 Starting weight: 471 lbs Starting date: 10/06/2019 Weight change since last visit: 11 lbs Total lbs lost to date: 50 lbs Total weight loss percentage to date: -10.62%  Nutrition Plan: Category 4 Plan for 90% of the time. Activity: Increased walking. Anti-obesity medications: Mounjaro 10 mg subcutaneously weekly. Reported side effects: None.  Interim History: Kathern is down 60 pounds since restarting the program.  She has been on Mounjaro 10 mg weekly for 1 week and is tolerating it well.  Denies side effects. She says she is moving more throughout the day - walking around stores, at work, at home.  Assessment/Plan:   1. Vitamin D deficiency Not at goal.  She is taking vitamin D 50,000 IU weekly.  Plan: Continue to take prescription Vitamin D @50 ,000 IU every week as prescribed.  Follow-up for routine testing of Vitamin D, at least 2-3 times per year to avoid over-replacement.  Lab Results  Component Value Date   VD25OH 25.6 (L) 10/23/2020   VD25OH 34.5 10/16/2019   VD25OH 9 (L) 01/25/2018   - Refill Vitamin D, Ergocalciferol, (DRISDOL) 1.25 MG (50000 UNIT) CAPS capsule; Take 1 capsule (50,000 Units total) by mouth every 7 (seven) days.  Dispense: 12 capsule; Refill: 0  2. Impaired fasting glucose, with polyphagia Controlled. Current treatment: Mounjaro 10 mg subcutaneously weekly. She will continue to focus on protein-rich, low simple carbohydrate foods. We reviewed the importance of hydration, regular exercise for stress reduction, and restorative sleep.  Plan:  Refill Mounjaro 10 mg subcutaneously weekly, as per below.  - Refill tirzepatide (MOUNJARO) 10 MG/0.5ML Pen; Inject 10 mg into the skin once a week.   Dispense: 2 mL; Refill: 0  4. Obesity, current BMI 66  Course: Kathryn Chan is currently in the action stage of change. As such, her goal is to continue with weight loss efforts.   Nutrition goals: She has agreed to practicing portion control and making smarter food choices, such as increasing vegetables and decreasing simple carbohydrates. Bariatric Plate.  Exercise goals:  Increase NEAT.  Behavioral modification strategies: increasing lean protein intake and increasing water intake.  Sani has agreed to follow-up with our clinic in 3 weeks. She was informed of the importance of frequent follow-up visits to maximize her success with intensive lifestyle modifications for her multiple health conditions.   Objective:   Blood pressure 123/65, pulse (!) 59, temperature 98 F (36.7 C), temperature source Oral, height 5\' 7"  (1.702 m), weight (!) 421 lb (191 kg), SpO2 96 %. Body mass index is 65.94 kg/m.  General: Cooperative, alert, well developed, in no acute distress. HEENT: Conjunctivae and lids unremarkable. Cardiovascular: Regular rhythm.  Lungs: Normal work of breathing. Neurologic: No focal deficits.   Lab Results  Component Value Date   CREATININE 0.66 10/23/2020   BUN 11 10/23/2020   NA 135 10/23/2020   K 4.4 10/23/2020   CL 96 10/23/2020   CO2 25 10/23/2020   Lab Results  Component Value Date   ALT 29 10/23/2020   AST 27 10/23/2020   ALKPHOS 62 10/23/2020   BILITOT 0.3 10/23/2020   Lab Results  Component Value Date   HGBA1C 6.3 (H) 10/23/2020   HGBA1C 6.1 (H) 10/16/2019   HGBA1C 6.1 (H) 01/25/2018   HGBA1C 6.0 (  H) 04/04/2017   HGBA1C 5.8 (H) 07/21/2016   Lab Results  Component Value Date   INSULIN 93.3 (H) 10/23/2020   INSULIN 86.7 (H) 10/16/2019   Lab Results  Component Value Date   TSH 2.030 10/23/2020   Lab Results  Component Value Date   CHOL 120 10/23/2020   HDL 46 10/23/2020   LDLCALC 54 10/23/2020   TRIG 110 10/23/2020   CHOLHDL 2.6 10/23/2020    Lab Results  Component Value Date   VD25OH 25.6 (L) 10/23/2020   VD25OH 34.5 10/16/2019   VD25OH 9 (L) 01/25/2018   Lab Results  Component Value Date   WBC 7.3 10/23/2020   HGB 13.1 10/23/2020   HCT 43.3 10/23/2020   MCV 94 10/23/2020   PLT 294 10/23/2020   Lab Results  Component Value Date   IRON 65 10/23/2020   TIBC 319 10/23/2020   FERRITIN 94 10/23/2020   Attestation Statements:   Reviewed by clinician on day of visit: allergies, medications, problem list, medical history, surgical history, family history, social history, and previous encounter notes.  I, Water quality scientist, CMA, am acting as transcriptionist for Briscoe Deutscher, DO  I have reviewed the above documentation for accuracy and completeness, and I agree with the above. Briscoe Deutscher, DO

## 2021-02-11 ENCOUNTER — Other Ambulatory Visit: Payer: Self-pay

## 2021-02-11 ENCOUNTER — Encounter (INDEPENDENT_AMBULATORY_CARE_PROVIDER_SITE_OTHER): Payer: Self-pay | Admitting: Family Medicine

## 2021-02-11 ENCOUNTER — Ambulatory Visit (INDEPENDENT_AMBULATORY_CARE_PROVIDER_SITE_OTHER): Payer: BC Managed Care – PPO | Admitting: Family Medicine

## 2021-02-11 VITALS — BP 124/65 | HR 65 | Temp 97.9°F | Ht 67.0 in | Wt >= 6400 oz

## 2021-02-11 DIAGNOSIS — R7301 Impaired fasting glucose: Secondary | ICD-10-CM

## 2021-02-11 DIAGNOSIS — F3289 Other specified depressive episodes: Secondary | ICD-10-CM

## 2021-02-11 DIAGNOSIS — N926 Irregular menstruation, unspecified: Secondary | ICD-10-CM | POA: Diagnosis not present

## 2021-02-11 DIAGNOSIS — Z6841 Body Mass Index (BMI) 40.0 and over, adult: Secondary | ICD-10-CM

## 2021-02-11 MED ORDER — TIRZEPATIDE 12.5 MG/0.5ML ~~LOC~~ SOAJ: 12.5000 mg | SUBCUTANEOUS | 1 refills | Status: DC

## 2021-02-12 NOTE — Progress Notes (Signed)
Chief Complaint:   OBESITY Kathryn Chan is here to discuss her progress with her obesity treatment plan along with follow-up of her obesity related diagnoses. See Medical Weight Management Flowsheet for complete bioelectrical impedance results.  Today's visit was #: 11 Starting weight: 471 lbs Starting date: 10/06/2019 Weight change since last visit: 11 lbs Total lbs lost to date: 61 lbs Total weight loss percentage to date: -12.95%  Nutrition Plan: Category 4 Plan for 60-70% of the time. Activity: Increased activity and walking. Anti-obesity medications: Mounjaro 10 mg subcutaneously weekly. Reported side effects: None.  Interim History: Kathryn Chan is down 70 pounds today.  She has gone from a size 34-28.  She had a vacation birthday celebration and a friend's wedding celebration since her last visit.  She says she is choosing healthy foods, mindfully.  Assessment/Plan:   1. Impaired fasting glucose, with polyphagia Improving, but not optimized. Current treatment: Mounjaro 10 mg subcutaneously weekly. She will continue to focus on protein-rich, low simple carbohydrate foods. We reviewed the importance of hydration, regular exercise for stress reduction, and restorative sleep.  Plan:  Increase Mounjaro to 12.5 mg subcutaneously weekly, as per below.  - Increase tirzepatide (MOUNJARO) 12.5 MG/0.5ML Pen; Inject 12.5 mg into the skin once a week.  Dispense: 2 mL; Refill: 1  2. Irregular menses May have had increased polyphagia and cravings last week due to PMS. Will continue to monitor as it relates to her weight loss journey.  3. Emotional eating tendencies Improving. Medication: None.   Plan:  Discussed cues and consequences, how thoughts affect eating, model of thoughts, feelings, and behaviors, and strategies for change by focusing on the cue. Discussed cognitive distortions, coping thoughts, and how to change your thoughts.  4. Obesity, current BMI 64.3  Course: Kathryn Chan is  currently in the action stage of change. As such, her goal is to continue with weight loss efforts.   Nutrition goals: She has agreed to practicing portion control and making smarter food choices, such as increasing vegetables and decreasing simple carbohydrates.   Exercise goals:  As is.  Behavioral modification strategies: increasing lean protein intake, decreasing simple carbohydrates, increasing vegetables, and increasing water intake.  Kathryn Chan has agreed to follow-up with our clinic in 4 weeks. She was informed of the importance of frequent follow-up visits to maximize her success with intensive lifestyle modifications for her multiple health conditions.   Objective:   Blood pressure 124/65, pulse 65, temperature 97.9 F (36.6 C), temperature source Oral, height 5\' 7"  (1.702 m), weight (!) 410 lb (186 kg), SpO2 95 %. Body mass index is 64.22 kg/m.  General: Cooperative, alert, well developed, in no acute distress. HEENT: Conjunctivae and lids unremarkable. Cardiovascular: Regular rhythm.  Lungs: Normal work of breathing. Neurologic: No focal deficits.   Lab Results  Component Value Date   CREATININE 0.66 10/23/2020   BUN 11 10/23/2020   NA 135 10/23/2020   K 4.4 10/23/2020   CL 96 10/23/2020   CO2 25 10/23/2020   Lab Results  Component Value Date   ALT 29 10/23/2020   AST 27 10/23/2020   ALKPHOS 62 10/23/2020   BILITOT 0.3 10/23/2020   Lab Results  Component Value Date   HGBA1C 6.3 (H) 10/23/2020   HGBA1C 6.1 (H) 10/16/2019   HGBA1C 6.1 (H) 01/25/2018   HGBA1C 6.0 (H) 04/04/2017   HGBA1C 5.8 (H) 07/21/2016   Lab Results  Component Value Date   INSULIN 93.3 (H) 10/23/2020   INSULIN 86.7 (H) 10/16/2019  Lab Results  Component Value Date   TSH 2.030 10/23/2020   Lab Results  Component Value Date   CHOL 120 10/23/2020   HDL 46 10/23/2020   LDLCALC 54 10/23/2020   TRIG 110 10/23/2020   CHOLHDL 2.6 10/23/2020   Lab Results  Component Value Date    VD25OH 25.6 (L) 10/23/2020   VD25OH 34.5 10/16/2019   VD25OH 9 (L) 01/25/2018   Lab Results  Component Value Date   WBC 7.3 10/23/2020   HGB 13.1 10/23/2020   HCT 43.3 10/23/2020   MCV 94 10/23/2020   PLT 294 10/23/2020   Lab Results  Component Value Date   IRON 65 10/23/2020   TIBC 319 10/23/2020   FERRITIN 94 10/23/2020   Attestation Statements:   Reviewed by clinician on day of visit: allergies, medications, problem list, medical history, surgical history, family history, social history, and previous encounter notes.  I, Water quality scientist, CMA, am acting as transcriptionist for Briscoe Deutscher, DO  I have reviewed the above documentation for accuracy and completeness, and I agree with the above. -  Briscoe Deutscher, DO, MS, FAAFP, DABOM - Family and Bariatric Medicine.

## 2021-03-04 ENCOUNTER — Other Ambulatory Visit: Payer: Self-pay

## 2021-03-04 ENCOUNTER — Encounter (INDEPENDENT_AMBULATORY_CARE_PROVIDER_SITE_OTHER): Payer: Self-pay | Admitting: Family Medicine

## 2021-03-04 ENCOUNTER — Ambulatory Visit (INDEPENDENT_AMBULATORY_CARE_PROVIDER_SITE_OTHER): Payer: BC Managed Care – PPO | Admitting: Family Medicine

## 2021-03-04 ENCOUNTER — Other Ambulatory Visit (INDEPENDENT_AMBULATORY_CARE_PROVIDER_SITE_OTHER): Payer: Self-pay | Admitting: Family Medicine

## 2021-03-04 VITALS — BP 126/74 | HR 62 | Temp 97.6°F | Ht 67.0 in | Wt 397.0 lb

## 2021-03-04 DIAGNOSIS — R600 Localized edema: Secondary | ICD-10-CM

## 2021-03-04 DIAGNOSIS — I10 Essential (primary) hypertension: Secondary | ICD-10-CM

## 2021-03-04 DIAGNOSIS — E8881 Metabolic syndrome: Secondary | ICD-10-CM

## 2021-03-04 DIAGNOSIS — E559 Vitamin D deficiency, unspecified: Secondary | ICD-10-CM

## 2021-03-04 DIAGNOSIS — Z79899 Other long term (current) drug therapy: Secondary | ICD-10-CM | POA: Diagnosis not present

## 2021-03-04 DIAGNOSIS — Z6841 Body Mass Index (BMI) 40.0 and over, adult: Secondary | ICD-10-CM

## 2021-03-04 DIAGNOSIS — R7303 Prediabetes: Secondary | ICD-10-CM

## 2021-03-04 DIAGNOSIS — E66813 Obesity, class 3: Secondary | ICD-10-CM

## 2021-03-04 NOTE — Progress Notes (Signed)
Chief Complaint:   OBESITY Kathryn Chan is here to discuss her progress with her obesity treatment plan along with follow-up of her obesity related diagnoses. See Medical Weight Management Flowsheet for complete bioelectrical impedance results.  Today's visit was #: 12 Starting weight: 471 lbs Starting date: 10/06/2019 Weight change since last visit: 13 lbs Total lbs lost to date: 74 lbs Total weight loss percentage to date: -15.71%  Nutrition Plan: Category 4 Meal Plan for 75% of the time.  Activity: Increased activity. Anti-obesity medications: Mounjaro 12.5 mg subcutaneously weekly. Reported side effects: None.  Interim History: Kathryn Chan is down 83 pounds since 10/2020, when starting Mounjaro.  She finished the 10 mg dose last week and will start 12.5 mg weekly this week.  She endorses hormone-related appetite changes.  No regular menses. She is taking a prenatal vitamin.  Assessment/Plan:   1. Essential hypertension At goal. Medications: lisinopril 20 mg daily, Lasix 20 mg daily.   Plan: Avoid buying foods that are: processed, frozen, or prepackaged to avoid excess salt. We will watch for signs of hypotension as she continues lifestyle modifications.  BP Readings from Last 3 Encounters:  03/04/21 126/74  02/11/21 124/65  01/19/21 123/65   Lab Results  Component Value Date   CREATININE 0.66 10/23/2020   2. Prediabetes Not at goal. Goal is HgbA1c < 5.7.  Medication: Mounjaro 10 mg subcutaneously weekly.    Plan:  Increase Mounjaro to 12.5 mg subcutaneously weekly.  She already has this dose on hand. She will continue to focus on protein-rich, low simple carbohydrate foods. We reviewed the importance of hydration, regular exercise for stress reduction, and restorative sleep.  Check labs today.  Lab Results  Component Value Date   HGBA1C 6.3 (H) 10/23/2020   Lab Results  Component Value Date   INSULIN 93.3 (H) 10/23/2020   INSULIN 86.7 (H) 10/16/2019   - Hemoglobin  A1c - Insulin, random  3. Vitamin D deficiency Not at goal. She is taking vitamin D 50,000 IU weekly.  Plan: Continue to take prescription Vitamin D @50 ,000 IU every week as prescribed.  Will check vitamin D level today.  Lab Results  Component Value Date   VD25OH 25.6 (L) 10/23/2020   VD25OH 34.5 10/16/2019   VD25OH 9 (L) 01/25/2018   - VITAMIN D 25 Hydroxy (Vit-D Deficiency, Fractures)  4. Bilateral lower extremity edema Continue Lasix 20 mg daily for edema. Will check labs today, as per below.  - CBC with Differential/Platelet - Comprehensive metabolic panel  5. Metabolic syndrome Starting goal: Lose 7-10% of starting weight. She will continue to focus on protein-rich, low simple carbohydrate foods. We reviewed the importance of hydration, regular exercise for stress reduction, and restorative sleep.  We will continue to check lab work every 3 months, with 10% weight loss, or should any other concerns arise.  Check lipid panel today.  - Lipid panel  6. Medication management Will check vitamin B12 level today.  - Vitamin B12  7. Obesity BMI today is 60  Course: Kathryn Chan is currently in the action stage of change. As such, her goal is to continue with weight loss efforts.   Nutrition goals: She has agreed to the Category 4 Plan.   Exercise goals:  As is.  Behavioral modification strategies: increasing lean protein intake, decreasing simple carbohydrates, increasing vegetables, and increasing water intake.  Kathryn Chan has agreed to follow-up with our clinic in 3 weeks. She was informed of the importance of frequent follow-up visits to maximize her  success with intensive lifestyle modifications for her multiple health conditions.   Kathryn Chan was informed we would discuss her lab results at her next visit unless there is a critical issue that needs to be addressed sooner. Kathryn Chan agreed to keep her next visit at the agreed upon time to discuss these results.  Objective:   Blood  pressure 126/74, pulse 62, temperature 97.6 F (36.4 C), temperature source Oral, height 5\' 7"  (1.702 m), weight (!) 397 lb (180.1 kg), SpO2 98 %. Body mass index is 62.18 kg/m.  General: Cooperative, alert, well developed, in no acute distress. HEENT: Conjunctivae and lids unremarkable. Cardiovascular: Regular rhythm.  Lungs: Normal work of breathing. Neurologic: No focal deficits.   Lab Results  Component Value Date   CREATININE 0.66 10/23/2020   BUN 11 10/23/2020   NA 135 10/23/2020   K 4.4 10/23/2020   CL 96 10/23/2020   CO2 25 10/23/2020   Lab Results  Component Value Date   ALT 29 10/23/2020   AST 27 10/23/2020   ALKPHOS 62 10/23/2020   BILITOT 0.3 10/23/2020   Lab Results  Component Value Date   HGBA1C 6.3 (H) 10/23/2020   HGBA1C 6.1 (H) 10/16/2019   HGBA1C 6.1 (H) 01/25/2018   HGBA1C 6.0 (H) 04/04/2017   HGBA1C 5.8 (H) 07/21/2016   Lab Results  Component Value Date   INSULIN 93.3 (H) 10/23/2020   INSULIN 86.7 (H) 10/16/2019   Lab Results  Component Value Date   TSH 2.030 10/23/2020   Lab Results  Component Value Date   CHOL 120 10/23/2020   HDL 46 10/23/2020   LDLCALC 54 10/23/2020   TRIG 110 10/23/2020   CHOLHDL 2.6 10/23/2020   Lab Results  Component Value Date   VD25OH 25.6 (L) 10/23/2020   VD25OH 34.5 10/16/2019   VD25OH 9 (L) 01/25/2018   Lab Results  Component Value Date   WBC 7.3 10/23/2020   HGB 13.1 10/23/2020   HCT 43.3 10/23/2020   MCV 94 10/23/2020   PLT 294 10/23/2020   Lab Results  Component Value Date   IRON 65 10/23/2020   TIBC 319 10/23/2020   FERRITIN 94 10/23/2020   Attestation Statements:   Reviewed by clinician on day of visit: allergies, medications, problem list, medical history, surgical history, family history, social history, and previous encounter notes.  I, Water quality scientist, CMA, am acting as transcriptionist for Briscoe Deutscher, DO  I have reviewed the above documentation for accuracy and completeness, and I  agree with the above. -  Briscoe Deutscher, DO, MS, FAAFP, DABOM - Family and Bariatric Medicine.

## 2021-03-05 LAB — VITAMIN D 25 HYDROXY (VIT D DEFICIENCY, FRACTURES): Vit D, 25-Hydroxy: 51 ng/mL (ref 30.0–100.0)

## 2021-03-05 LAB — COMPREHENSIVE METABOLIC PANEL
ALT: 32 IU/L (ref 0–32)
AST: 29 IU/L (ref 0–40)
Albumin/Globulin Ratio: 1.6 (ref 1.2–2.2)
Albumin: 4.2 g/dL (ref 3.8–4.8)
Alkaline Phosphatase: 65 IU/L (ref 44–121)
BUN/Creatinine Ratio: 11 (ref 9–23)
BUN: 7 mg/dL (ref 6–20)
Bilirubin Total: 0.2 mg/dL (ref 0.0–1.2)
CO2: 23 mmol/L (ref 20–29)
Calcium: 9.5 mg/dL (ref 8.7–10.2)
Chloride: 100 mmol/L (ref 96–106)
Creatinine, Ser: 0.65 mg/dL (ref 0.57–1.00)
Globulin, Total: 2.7 g/dL (ref 1.5–4.5)
Glucose: 92 mg/dL (ref 70–99)
Potassium: 4.4 mmol/L (ref 3.5–5.2)
Sodium: 138 mmol/L (ref 134–144)
Total Protein: 6.9 g/dL (ref 6.0–8.5)
eGFR: 119 mL/min/{1.73_m2} (ref >59)

## 2021-03-05 LAB — CBC WITH DIFFERENTIAL/PLATELET
Basophils Absolute: 0 10*3/uL (ref 0.0–0.2)
Basos: 1 %
EOS (ABSOLUTE): 0.5 10*3/uL — ABNORMAL HIGH (ref 0.0–0.4)
Eos: 7 %
Hematocrit: 41.3 % (ref 34.0–46.6)
Hemoglobin: 13.3 g/dL (ref 11.1–15.9)
Immature Grans (Abs): 0 10*3/uL (ref 0.0–0.1)
Immature Granulocytes: 0 %
Lymphocytes Absolute: 1.7 10*3/uL (ref 0.7–3.1)
Lymphs: 26 %
MCH: 30 pg (ref 26.6–33.0)
MCHC: 32.2 g/dL (ref 31.5–35.7)
MCV: 93 fL (ref 79–97)
Monocytes Absolute: 0.4 10*3/uL (ref 0.1–0.9)
Monocytes: 7 %
Neutrophils Absolute: 3.9 10*3/uL (ref 1.4–7.0)
Neutrophils: 59 %
Platelets: 272 10*3/uL (ref 150–450)
RBC: 4.44 x10E6/uL (ref 3.77–5.28)
RDW: 13.5 % (ref 11.7–15.4)
WBC: 6.6 10*3/uL (ref 3.4–10.8)

## 2021-03-05 LAB — LIPID PANEL
Chol/HDL Ratio: 2.8 ratio (ref 0.0–4.4)
Cholesterol, Total: 110 mg/dL (ref 100–199)
HDL: 39 mg/dL — ABNORMAL LOW (ref >39)
LDL Chol Calc (NIH): 51 mg/dL (ref 0–99)
Triglycerides: 106 mg/dL (ref 0–149)
VLDL Cholesterol Cal: 20 mg/dL (ref 5–40)

## 2021-03-05 LAB — HEMOGLOBIN A1C
Est. average glucose Bld gHb Est-mCnc: 108 mg/dL
Hgb A1c MFr Bld: 5.4 % (ref 4.8–5.6)

## 2021-03-05 LAB — VITAMIN B12: Vitamin B-12: 562 pg/mL (ref 232–1245)

## 2021-03-05 LAB — INSULIN, RANDOM: INSULIN: 32.9 u[IU]/mL — ABNORMAL HIGH (ref 2.6–24.9)

## 2021-03-05 NOTE — Telephone Encounter (Signed)
LOV Dr. Veva Holes

## 2021-03-07 ENCOUNTER — Other Ambulatory Visit (INDEPENDENT_AMBULATORY_CARE_PROVIDER_SITE_OTHER): Payer: Self-pay | Admitting: Family Medicine

## 2021-03-07 ENCOUNTER — Encounter (INDEPENDENT_AMBULATORY_CARE_PROVIDER_SITE_OTHER): Payer: Self-pay | Admitting: Family Medicine

## 2021-03-07 DIAGNOSIS — I10 Essential (primary) hypertension: Secondary | ICD-10-CM

## 2021-03-09 ENCOUNTER — Other Ambulatory Visit (INDEPENDENT_AMBULATORY_CARE_PROVIDER_SITE_OTHER): Payer: Self-pay | Admitting: Family Medicine

## 2021-03-09 DIAGNOSIS — I10 Essential (primary) hypertension: Secondary | ICD-10-CM

## 2021-03-09 MED ORDER — LISINOPRIL 20 MG PO TABS: ORAL_TABLET | ORAL | 0 refills | Status: DC

## 2021-03-09 NOTE — Telephone Encounter (Signed)
Last OV with Dr Wallace 

## 2021-03-25 ENCOUNTER — Encounter (INDEPENDENT_AMBULATORY_CARE_PROVIDER_SITE_OTHER): Payer: Self-pay | Admitting: Family Medicine

## 2021-03-25 ENCOUNTER — Ambulatory Visit (INDEPENDENT_AMBULATORY_CARE_PROVIDER_SITE_OTHER): Payer: BC Managed Care – PPO | Admitting: Family Medicine

## 2021-03-25 ENCOUNTER — Other Ambulatory Visit: Payer: Self-pay

## 2021-03-25 VITALS — BP 118/64 | HR 64 | Temp 98.2°F | Ht 67.0 in | Wt 381.0 lb

## 2021-03-25 DIAGNOSIS — R600 Localized edema: Secondary | ICD-10-CM | POA: Diagnosis not present

## 2021-03-25 DIAGNOSIS — I10 Essential (primary) hypertension: Secondary | ICD-10-CM

## 2021-03-25 DIAGNOSIS — R7301 Impaired fasting glucose: Secondary | ICD-10-CM

## 2021-03-25 DIAGNOSIS — Z6841 Body Mass Index (BMI) 40.0 and over, adult: Secondary | ICD-10-CM

## 2021-03-25 NOTE — Progress Notes (Signed)
Chief Complaint:   OBESITY Kathryn Chan is here to discuss her progress with her obesity treatment plan along with follow-up of her obesity related diagnoses. See Medical Weight Management Flowsheet for complete bioelectrical impedance results.  Today's visit was #: 21 Starting weight: 471 lbs Starting date: 10/06/2019 Weight change since last visit: 16 lbs Total lbs lost to date: 90 lbs Total weight loss percentage to date: -19.11%  Nutrition Plan: Category 4 Plan for 75% of the time.  Activity: Increased activity. Anti-obesity medications: Mounjaro 12.5 mg subcutaneously weekly. Reported side effects: None.  Interim History: Kathryn Chan is down 99 pounds since starting Mounjaro on 10/29/2020.  She says she feels great.  Labs reviewed - all WNL or improved.  Assessment/Plan:   1. Impaired fasting glucose, with polyphagia Controlled. Current treatment: Mounjaro 12.5 mg subcutaneously weekly.    Plan:  Continue Mounjaro 12.5 mg subcutaneously weekly.  She will continue to focus on protein-rich, low simple carbohydrate foods. We reviewed the importance of hydration, regular exercise for stress reduction, and restorative sleep.  2. Essential hypertension, controlled with Lisinopril and Lasix At goal. Medications: lisinopril 20 mg daily, Lasix 20 mg daily.   Plan: Avoid buying foods that are: processed, frozen, or prepackaged to avoid excess salt. We will watch for signs of hypotension as she continues lifestyle modifications.  BP Readings from Last 3 Encounters:  03/25/21 118/64  03/04/21 126/74  02/11/21 124/65   Lab Results  Component Value Date   CREATININE 0.65 03/04/2021   3. Bilateral lower extremity edema, controlled with Lasix Controlled.  Kathryn Chan is taking Lasix 20 mg daily for bilateral LE edema.    4. Obesity, with current BMI 59.8  Course: Kathryn Chan is currently in the action stage of change. As such, her goal is to continue with weight loss efforts.   Nutrition goals:  She has agreed to the Category 4 Plan.   Exercise goals:  As is.  Behavioral modification strategies: increasing lean protein intake, decreasing simple carbohydrates, increasing vegetables, and increasing water intake.  Kathryn Chan has agreed to follow-up with our clinic in 6 weeks. She was informed of the importance of frequent follow-up visits to maximize her success with intensive lifestyle modifications for her multiple health conditions.   Objective:   Blood pressure 118/64, pulse 64, temperature 98.2 F (36.8 C), temperature source Oral, height 5\' 7"  (1.702 m), weight (!) 381 lb (172.8 kg), SpO2 99 %. Body mass index is 59.67 kg/m.  General: Cooperative, alert, well developed, in no acute distress. HEENT: Conjunctivae and lids unremarkable. Cardiovascular: Regular rhythm.  Lungs: Normal work of breathing. Neurologic: No focal deficits.   Lab Results  Component Value Date   CREATININE 0.65 03/04/2021   BUN 7 03/04/2021   NA 138 03/04/2021   K 4.4 03/04/2021   CL 100 03/04/2021   CO2 23 03/04/2021   Lab Results  Component Value Date   ALT 32 03/04/2021   AST 29 03/04/2021   ALKPHOS 65 03/04/2021   BILITOT 0.2 03/04/2021   Lab Results  Component Value Date   HGBA1C 5.4 03/04/2021   HGBA1C 6.3 (H) 10/23/2020   HGBA1C 6.1 (H) 10/16/2019   HGBA1C 6.1 (H) 01/25/2018   HGBA1C 6.0 (H) 04/04/2017   Lab Results  Component Value Date   INSULIN 32.9 (H) 03/04/2021   INSULIN 93.3 (H) 10/23/2020   INSULIN 86.7 (H) 10/16/2019   Lab Results  Component Value Date   TSH 2.030 10/23/2020   Lab Results  Component Value Date  CHOL 110 03/04/2021   HDL 39 (L) 03/04/2021   LDLCALC 51 03/04/2021   TRIG 106 03/04/2021   CHOLHDL 2.8 03/04/2021   Lab Results  Component Value Date   VD25OH 51.0 03/04/2021   VD25OH 25.6 (L) 10/23/2020   VD25OH 34.5 10/16/2019   Lab Results  Component Value Date   WBC 6.6 03/04/2021   HGB 13.3 03/04/2021   HCT 41.3 03/04/2021   MCV 93  03/04/2021   PLT 272 03/04/2021   Lab Results  Component Value Date   IRON 65 10/23/2020   TIBC 319 10/23/2020   FERRITIN 94 10/23/2020   Attestation Statements:   Reviewed by clinician on day of visit: allergies, medications, problem list, medical history, surgical history, family history, social history, and previous encounter notes.  I, Water quality scientist, CMA, am acting as transcriptionist for Briscoe Deutscher, DO  I have reviewed the above documentation for accuracy and completeness, and I agree with the above. -  Briscoe Deutscher, DO, MS, FAAFP, DABOM - Family and Bariatric Medicine.

## 2021-04-01 ENCOUNTER — Encounter (INDEPENDENT_AMBULATORY_CARE_PROVIDER_SITE_OTHER): Payer: Self-pay | Admitting: Family Medicine

## 2021-04-02 ENCOUNTER — Other Ambulatory Visit (HOSPITAL_COMMUNITY): Payer: Self-pay

## 2021-04-02 MED ORDER — TIRZEPATIDE 12.5 MG/0.5ML ~~LOC~~ SOAJ
12.5000 mg | SUBCUTANEOUS | 1 refills | Status: DC
  Filled 2021-04-02: qty 2, 28d supply, fill #0

## 2021-04-09 ENCOUNTER — Ambulatory Visit (INDEPENDENT_AMBULATORY_CARE_PROVIDER_SITE_OTHER): Payer: BC Managed Care – PPO | Admitting: Family Medicine

## 2021-04-20 ENCOUNTER — Other Ambulatory Visit (INDEPENDENT_AMBULATORY_CARE_PROVIDER_SITE_OTHER): Payer: Self-pay | Admitting: Family Medicine

## 2021-04-22 ENCOUNTER — Encounter (INDEPENDENT_AMBULATORY_CARE_PROVIDER_SITE_OTHER): Payer: Self-pay | Admitting: Family Medicine

## 2021-04-22 ENCOUNTER — Other Ambulatory Visit (HOSPITAL_COMMUNITY): Payer: Self-pay

## 2021-04-22 MED ORDER — MOUNJARO 12.5 MG/0.5ML ~~LOC~~ SOAJ
12.5000 mg | SUBCUTANEOUS | 0 refills | Status: DC
Start: 2021-04-22 — End: 2021-05-11
  Filled 2021-04-22 (×3): qty 2, 28d supply, fill #0
  Filled 2021-05-06: qty 2, 28d supply, fill #1

## 2021-04-24 ENCOUNTER — Other Ambulatory Visit (HOSPITAL_COMMUNITY): Payer: Self-pay

## 2021-05-06 ENCOUNTER — Other Ambulatory Visit: Payer: Self-pay

## 2021-05-06 ENCOUNTER — Ambulatory Visit (INDEPENDENT_AMBULATORY_CARE_PROVIDER_SITE_OTHER): Payer: BC Managed Care – PPO | Admitting: Family Medicine

## 2021-05-06 ENCOUNTER — Encounter (INDEPENDENT_AMBULATORY_CARE_PROVIDER_SITE_OTHER): Payer: Self-pay | Admitting: Family Medicine

## 2021-05-06 ENCOUNTER — Other Ambulatory Visit (HOSPITAL_COMMUNITY): Payer: Self-pay

## 2021-05-06 VITALS — BP 100/60 | HR 74 | Temp 97.6°F | Ht 67.0 in | Wt 371.0 lb

## 2021-05-06 DIAGNOSIS — E559 Vitamin D deficiency, unspecified: Secondary | ICD-10-CM

## 2021-05-06 DIAGNOSIS — Z6841 Body Mass Index (BMI) 40.0 and over, adult: Secondary | ICD-10-CM

## 2021-05-06 DIAGNOSIS — E669 Obesity, unspecified: Secondary | ICD-10-CM | POA: Diagnosis not present

## 2021-05-06 DIAGNOSIS — I10 Essential (primary) hypertension: Secondary | ICD-10-CM

## 2021-05-06 DIAGNOSIS — R7301 Impaired fasting glucose: Secondary | ICD-10-CM | POA: Diagnosis not present

## 2021-05-06 MED ORDER — VITAMIN D (ERGOCALCIFEROL) 1.25 MG (50000 UNIT) PO CAPS: 50000.0000 [IU] | ORAL_CAPSULE | ORAL | 0 refills | Status: DC

## 2021-05-06 NOTE — Progress Notes (Signed)
Chief Complaint:   OBESITY Kathryn Chan is here to discuss her progress with her obesity treatment plan along with follow-up of her obesity related diagnoses. See Medical Weight Management Flowsheet for complete bioelectrical impedance results.  Today's visit was #: 14 Starting weight: 471 lbs Starting date: 10/06/2019 Weight change since last visit: 10 lbs Total lbs lost to date: 100 lbs Total weight loss percentage to date: -21.23%  Nutrition Plan: Category 4 Plan for 10% of the time.  Activity: Increased activity. Anti-obesity medications: Mounjaro 12.5 mg subcutaneously weekly. Reported side effects: None.  Interim History: [name] is down 109 pounds since July 13th (started [facility]).  She continues to do well.  She felt that she overindulged around Christmas/New Years.  Still working on being kind to herself.  Assessment/Plan:   1. Vitamin D deficiency At goal.  She is taking vitamin D 50,000 IU weekly.  Plan: Continue to take prescription Vitamin D @50 ,000 IU every week as prescribed.  Follow-up for routine testing of Vitamin D, at least 2-3 times per year to avoid over-replacement.  Lab Results  Component Value Date   VD25OH 51.0 03/04/2021   VD25OH 25.6 (L) 10/23/2020   VD25OH 34.5 10/16/2019   - Refill Vitamin D, Ergocalciferol, (DRISDOL) 1.25 MG (50000 UNIT) CAPS capsule; Take 1 capsule (50,000 Units total) by mouth every 7 (seven) days.  Dispense: 12 capsule; Refill: 0  2. Impaired fasting glucose, with polyphagia Controlled. Current treatment: Mounjaro 12.5 mg subcutaneously weekly.    Plan: Continue Mounjaro 12.5 mg subcutaneously weekly.  She will continue to focus on protein-rich, low simple carbohydrate foods. We reviewed the importance of hydration, regular exercise for stress reduction, and restorative sleep.  3. Essential hypertension, controlled with Lisinopril and Lasix Low today.  No orthostatic signs/symptoms. Medications: lisinopril 20 mg daily.    Plan:  Will monitor.  Decrease lisinopril by half if BP continues to be low. Avoid buying foods that are: processed, frozen, or prepackaged to avoid excess salt.   BP Readings from Last 3 Encounters:  05/06/21 100/60  03/25/21 118/64  03/04/21 126/74   Lab Results  Component Value Date   CREATININE 0.65 03/04/2021   4. Obesity, with current BMI 58.2  Course: Marijayne is currently in the action stage of change. As such, her goal is to continue with weight loss efforts.   Nutrition goals: She has agreed to practicing portion control and making smarter food choices, such as increasing vegetables and decreasing simple carbohydrates.   Exercise goals:  As is.  Behavioral modification strategies: increasing lean protein intake, decreasing simple carbohydrates, increasing vegetables, and increasing water intake.  [name] has agreed to follow-up with our clinic in 4 weeks. She was informed of the importance of frequent follow-up visits to maximize her success with intensive lifestyle modifications for her multiple health conditions.   Objective:   Blood pressure 100/60, pulse 74, temperature 97.6 F (36.4 C), temperature source Oral, height 5\' 7"  (1.702 m), weight (!) 371 lb (168.3 kg), SpO2 97 %. Body mass index is 58.11 kg/m.  General: Cooperative, alert, well developed, in no acute distress. HEENT: Conjunctivae and lids unremarkable. Cardiovascular: Regular rhythm.  Lungs: Normal work of breathing. Neurologic: No focal deficits.   Lab Results  Component Value Date   CREATININE 0.65 03/04/2021   BUN 7 03/04/2021   NA 138 03/04/2021   K 4.4 03/04/2021   CL 100 03/04/2021   CO2 23 03/04/2021   Lab Results  Component Value Date   ALT 32  03/04/2021   AST 29 03/04/2021   ALKPHOS 65 03/04/2021   BILITOT 0.2 03/04/2021   Lab Results  Component Value Date   HGBA1C 5.4 03/04/2021   HGBA1C 6.3 (H) 10/23/2020   HGBA1C 6.1 (H) 10/16/2019   HGBA1C 6.1 (H) 01/25/2018   HGBA1C  6.0 (H) 04/04/2017   Lab Results  Component Value Date   INSULIN 32.9 (H) 03/04/2021   INSULIN 93.3 (H) 10/23/2020   INSULIN 86.7 (H) 10/16/2019   Lab Results  Component Value Date   TSH 2.030 10/23/2020   Lab Results  Component Value Date   CHOL 110 03/04/2021   HDL 39 (L) 03/04/2021   LDLCALC 51 03/04/2021   TRIG 106 03/04/2021   CHOLHDL 2.8 03/04/2021   Lab Results  Component Value Date   VD25OH 51.0 03/04/2021   VD25OH 25.6 (L) 10/23/2020   VD25OH 34.5 10/16/2019   Lab Results  Component Value Date   WBC 6.6 03/04/2021   HGB 13.3 03/04/2021   HCT 41.3 03/04/2021   MCV 93 03/04/2021   PLT 272 03/04/2021   Lab Results  Component Value Date   IRON 65 10/23/2020   TIBC 319 10/23/2020   FERRITIN 94 10/23/2020   Attestation Statements:   Reviewed by clinician on day of visit: allergies, medications, problem list, medical history, surgical history, family history, social history, and previous encounter notes.  I, Water quality scientist, CMA, am acting as transcriptionist for Briscoe Deutscher, DO  I have reviewed the above documentation for accuracy and completeness, and I agree with the above. -  Briscoe Deutscher, DO, MS, FAAFP, DABOM - Family and Bariatric Medicine.

## 2021-05-07 ENCOUNTER — Telehealth (INDEPENDENT_AMBULATORY_CARE_PROVIDER_SITE_OTHER): Payer: Self-pay

## 2021-05-07 DIAGNOSIS — R7301 Impaired fasting glucose: Secondary | ICD-10-CM

## 2021-05-07 NOTE — Telephone Encounter (Incomplete Revision)
Patient was seen yesterday and forgot to mention that she needs a 90 day supply of Mounjaro  sent to [facility].  Patient states Pharmacy told her they now have an unlimited supply. Thank you

## 2021-05-07 NOTE — Telephone Encounter (Addendum)
Patient was seen yesterday and forgot to mention that she needs a 90 day supply of Mounjaro  sent to [facility].  Patient states Pharmacy told her they now have an unlimited supply. Thank you

## 2021-05-11 ENCOUNTER — Other Ambulatory Visit (HOSPITAL_COMMUNITY): Payer: Self-pay

## 2021-05-11 MED ORDER — MOUNJARO 12.5 MG/0.5ML ~~LOC~~ SOAJ
12.5000 mg | SUBCUTANEOUS | 0 refills | Status: DC
  Filled 2021-05-11: qty 6, 84d supply, fill #0
  Filled 2021-05-13: qty 2, 28d supply, fill #0
  Filled 2021-05-13: qty 6, 84d supply, fill #0

## 2021-05-11 NOTE — Telephone Encounter (Signed)
RX sent

## 2021-05-13 ENCOUNTER — Other Ambulatory Visit (HOSPITAL_COMMUNITY): Payer: Self-pay

## 2021-05-18 ENCOUNTER — Encounter (INDEPENDENT_AMBULATORY_CARE_PROVIDER_SITE_OTHER): Payer: Self-pay | Admitting: Family Medicine

## 2021-05-19 NOTE — Telephone Encounter (Signed)
Dr.Wallace °

## 2021-05-28 ENCOUNTER — Other Ambulatory Visit (INDEPENDENT_AMBULATORY_CARE_PROVIDER_SITE_OTHER): Payer: Self-pay | Admitting: Family Medicine

## 2021-05-28 DIAGNOSIS — I10 Essential (primary) hypertension: Secondary | ICD-10-CM

## 2021-05-28 NOTE — Telephone Encounter (Signed)
LOV w/ Wallace

## 2021-06-03 ENCOUNTER — Encounter (INDEPENDENT_AMBULATORY_CARE_PROVIDER_SITE_OTHER): Payer: Self-pay | Admitting: Family Medicine

## 2021-06-03 ENCOUNTER — Other Ambulatory Visit: Payer: Self-pay

## 2021-06-03 ENCOUNTER — Other Ambulatory Visit (HOSPITAL_COMMUNITY): Payer: Self-pay

## 2021-06-03 ENCOUNTER — Ambulatory Visit (INDEPENDENT_AMBULATORY_CARE_PROVIDER_SITE_OTHER): Payer: BC Managed Care – PPO | Admitting: Family Medicine

## 2021-06-03 VITALS — BP 111/68 | HR 57 | Temp 97.7°F | Ht 67.0 in | Wt 366.0 lb

## 2021-06-03 DIAGNOSIS — R600 Localized edema: Secondary | ICD-10-CM | POA: Diagnosis not present

## 2021-06-03 DIAGNOSIS — R7301 Impaired fasting glucose: Secondary | ICD-10-CM | POA: Diagnosis not present

## 2021-06-03 DIAGNOSIS — Z6841 Body Mass Index (BMI) 40.0 and over, adult: Secondary | ICD-10-CM

## 2021-06-03 DIAGNOSIS — E669 Obesity, unspecified: Secondary | ICD-10-CM

## 2021-06-03 DIAGNOSIS — K5909 Other constipation: Secondary | ICD-10-CM

## 2021-06-03 MED ORDER — TIRZEPATIDE 15 MG/0.5ML ~~LOC~~ SOAJ
15.0000 mg | SUBCUTANEOUS | 0 refills | Status: DC
  Filled 2021-06-03 – 2021-06-08 (×4): qty 2, 28d supply, fill #0

## 2021-06-04 ENCOUNTER — Other Ambulatory Visit (HOSPITAL_COMMUNITY): Payer: Self-pay

## 2021-06-04 NOTE — Progress Notes (Signed)
Chief Complaint:   OBESITY Kathryn Chan is here to discuss her progress with her obesity treatment plan along with follow-up of her obesity related diagnoses. See Medical Weight Management Flowsheet for complete bioelectrical impedance results.  Today's visit was #: 15 Starting weight: 471 lbs Starting date: 10/06/2019 Weight change since last visit: 5 lbs Total lbs lost to date: 105 lbs Total weight loss percentage to date: -22.29%  Nutrition Plan: Category 4 Plan for 75-80% of the time.  Activity: Walking for 15 minutes 4 times per week.  Anti-obesity medications: Mounjaro 12.5 mg subcutaneously weekly. Reported side effects: None.  Interim History: [name] says she is walking 4 days per week, 2500-3500 steps.  She endorses constipation.  RMR today is 2347.  Assessment/Plan:   1. Impaired fasting glucose, with polyphagia Improving. Current treatment: Mounjaro 12.5 mg subcutaneously weekly.    Plan:  Increase Mounjaro to 15 mg subcutaneously weekly. She will continue to focus on protein-rich, low simple carbohydrate foods. We reviewed the importance of hydration, regular exercise for stress reduction, and restorative sleep.  - Increase tirzepatide (MOUNJARO) 15 MG/0.5ML Pen; Inject 15 mg into the skin once a week.  Dispense: 6 mL; Refill: 0  2. Other constipation She is taking psyllium husk 3 times per day. Counseling: Getting to Good Bowel Health: Your goal is to have one soft bowel movement each day. Drink at least 8 glasses of water each day. Eat plenty of fiber (goal is over 30 grams each day). It is best to get most of your fiber from dietary sources which includes leafy green vegetables, fresh fruit, and whole grains. You may need to add fiber with the help of OTC fiber supplements. These include Metamucil, Citrucel, and Benefiber. If you are still having trouble, try adding an osmotic laxative such as Miralax. If all of these changes do not work, Cabin crew.   3.  Lower extremity edema Well controlled on Lasix 20 mg daily as needed.  4. Obesity, with current BMI 57.4  Course: Treacy is currently in the action stage of change. As such, her goal is to continue with weight loss efforts.   Nutrition goals: She has agreed to the Category 4 Plan.   Exercise goals:  As is.  Behavioral modification strategies: keeping a strict food journal.  [name] has agreed to follow-up with our clinic in 4 weeks. She was informed of the importance of frequent follow-up visits to maximize her success with intensive lifestyle modifications for her multiple health conditions.   Objective:   Blood pressure 111/68, pulse (!) 57, temperature 97.7 F (36.5 C), temperature source Oral, height 5\' 7"  (1.702 m), weight (!) 366 lb (166 kg), SpO2 97 %. Body mass index is 57.32 kg/m.  General: Cooperative, alert, well developed, in no acute distress. HEENT: Conjunctivae and lids unremarkable. Cardiovascular: Regular rhythm.  Lungs: Normal work of breathing. Neurologic: No focal deficits.   Lab Results  Component Value Date   CREATININE 0.65 03/04/2021   BUN 7 03/04/2021   NA 138 03/04/2021   K 4.4 03/04/2021   CL 100 03/04/2021   CO2 23 03/04/2021   Lab Results  Component Value Date   ALT 32 03/04/2021   AST 29 03/04/2021   ALKPHOS 65 03/04/2021   BILITOT 0.2 03/04/2021   Lab Results  Component Value Date   HGBA1C 5.4 03/04/2021   HGBA1C 6.3 (H) 10/23/2020   HGBA1C 6.1 (H) 10/16/2019   HGBA1C 6.1 (H) 01/25/2018   HGBA1C 6.0 (H) 04/04/2017  Lab Results  Component Value Date   INSULIN 32.9 (H) 03/04/2021   INSULIN 93.3 (H) 10/23/2020   INSULIN 86.7 (H) 10/16/2019   Lab Results  Component Value Date   TSH 2.030 10/23/2020   Lab Results  Component Value Date   CHOL 110 03/04/2021   HDL 39 (L) 03/04/2021   LDLCALC 51 03/04/2021   TRIG 106 03/04/2021   CHOLHDL 2.8 03/04/2021   Lab Results  Component Value Date   VD25OH 51.0 03/04/2021   VD25OH  25.6 (L) 10/23/2020   VD25OH 34.5 10/16/2019   Lab Results  Component Value Date   WBC 6.6 03/04/2021   HGB 13.3 03/04/2021   HCT 41.3 03/04/2021   MCV 93 03/04/2021   PLT 272 03/04/2021   Lab Results  Component Value Date   IRON 65 10/23/2020   TIBC 319 10/23/2020   FERRITIN 94 10/23/2020   Attestation Statements:   Reviewed by clinician on day of visit: allergies, medications, problem list, medical history, surgical history, family history, social history, and previous encounter notes.  I, Water quality scientist, CMA, am acting as transcriptionist for Briscoe Deutscher, DO  I have reviewed the above documentation for accuracy and completeness, and I agree with the above. -  Briscoe Deutscher, DO, MS, FAAFP, DABOM - Family and Bariatric Medicine.

## 2021-06-08 ENCOUNTER — Encounter (INDEPENDENT_AMBULATORY_CARE_PROVIDER_SITE_OTHER): Payer: Self-pay | Admitting: Family Medicine

## 2021-06-08 ENCOUNTER — Other Ambulatory Visit (HOSPITAL_COMMUNITY): Payer: Self-pay

## 2021-06-08 DIAGNOSIS — R7301 Impaired fasting glucose: Secondary | ICD-10-CM

## 2021-06-09 ENCOUNTER — Other Ambulatory Visit (HOSPITAL_COMMUNITY): Payer: Self-pay

## 2021-06-09 MED ORDER — TIRZEPATIDE 12.5 MG/0.5ML ~~LOC~~ SOAJ
12.5000 mg | SUBCUTANEOUS | 0 refills | Status: DC
  Filled 2021-06-09: qty 2, 28d supply, fill #0

## 2021-07-08 ENCOUNTER — Ambulatory Visit (INDEPENDENT_AMBULATORY_CARE_PROVIDER_SITE_OTHER): Payer: BC Managed Care – PPO | Admitting: Family Medicine

## 2021-07-08 ENCOUNTER — Other Ambulatory Visit (HOSPITAL_COMMUNITY): Payer: Self-pay

## 2021-07-08 ENCOUNTER — Encounter (INDEPENDENT_AMBULATORY_CARE_PROVIDER_SITE_OTHER): Payer: Self-pay | Admitting: Family Medicine

## 2021-07-08 ENCOUNTER — Other Ambulatory Visit: Payer: Self-pay

## 2021-07-08 VITALS — BP 123/68 | HR 55 | Temp 97.9°F | Ht 67.0 in | Wt 356.0 lb

## 2021-07-08 DIAGNOSIS — E559 Vitamin D deficiency, unspecified: Secondary | ICD-10-CM | POA: Diagnosis not present

## 2021-07-08 DIAGNOSIS — E669 Obesity, unspecified: Secondary | ICD-10-CM

## 2021-07-08 DIAGNOSIS — R7301 Impaired fasting glucose: Secondary | ICD-10-CM | POA: Diagnosis not present

## 2021-07-08 DIAGNOSIS — Z6841 Body Mass Index (BMI) 40.0 and over, adult: Secondary | ICD-10-CM

## 2021-07-08 DIAGNOSIS — K5903 Drug induced constipation: Secondary | ICD-10-CM

## 2021-07-08 MED ORDER — VITAMIN D (ERGOCALCIFEROL) 1.25 MG (50000 UNIT) PO CAPS: 50000.0000 [IU] | ORAL_CAPSULE | ORAL | 0 refills | Status: DC

## 2021-07-08 MED ORDER — TIRZEPATIDE 15 MG/0.5ML ~~LOC~~ SOAJ
15.0000 mg | SUBCUTANEOUS | 3 refills | Status: DC
  Filled 2021-07-08 – 2021-09-10 (×2): qty 2, 28d supply, fill #0

## 2021-07-08 MED ORDER — TIRZEPATIDE 12.5 MG/0.5ML ~~LOC~~ SOAJ
12.5000 mg | SUBCUTANEOUS | 3 refills | Status: DC
  Filled 2021-07-08: qty 2, 28d supply, fill #0
  Filled 2021-08-11: qty 2, 28d supply, fill #1
  Filled 2021-09-09: qty 2, 28d supply, fill #2
  Filled 2021-09-10 – 2021-09-11 (×5): qty 2, 28d supply, fill #0

## 2021-07-09 ENCOUNTER — Other Ambulatory Visit (INDEPENDENT_AMBULATORY_CARE_PROVIDER_SITE_OTHER): Payer: Self-pay | Admitting: Family Medicine

## 2021-07-09 ENCOUNTER — Other Ambulatory Visit (HOSPITAL_COMMUNITY): Payer: Self-pay

## 2021-07-09 DIAGNOSIS — I10 Essential (primary) hypertension: Secondary | ICD-10-CM

## 2021-07-14 ENCOUNTER — Other Ambulatory Visit (HOSPITAL_COMMUNITY): Payer: Self-pay

## 2021-07-16 NOTE — Progress Notes (Signed)
Chief Complaint:   OBESITY Kathryn Chan is here to discuss her progress with her obesity treatment plan along with follow-up of her obesity related diagnoses.   Today's visit was #: 15 Starting weight: 471 lbs Starting date: 10/06/2019 Today's weight: 356 lbs Today's date: 07/08/2021 Weight change since last visit: 10 lbs Total lbs lost to date: 115 lbs Body mass index is 55.76 kg/m.  Total weight loss percentage to date: 2.73  Current Meal Plan: the Category 4 Plan for 75-80% of the time.  Current Exercise Plan: walking for 25 minutes 3-4 times per week. Current Anti-Obesity Medications: Mounjaro 12.'5mg'$  once weekly injection. Side effects: none reported.  Interim History: Kathryn Chan has been doing well and increasing her walking again since her last visit. She is still taking Mounjaro 12.5 mg once each week and she said that she is not having any more issues with constipation.  Assessment/Plan:   1. Impaired fasting glucose Improving. Current treatment: Mounjaro 12.5 mg subcutaneously weekly.     Plan: Continue Mounjaro to 12.5 mg subcutaneously weekly. She will continue to focus on protein-rich, low simple carbohydrate foods. We reviewed the importance of hydration, regular exercise for stress reduction, and restorative sleep.   - tirzepatide (MOUNJARO) 12.5 MG/0.5ML Pen; Inject 12.5 mg into the skin once a week.  Dispense: 6 mL; Refill: 3 - tirzepatide (MOUNJARO) 15 MG/0.5ML Pen; Inject 15 mg into the skin once a week.  Dispense: 6 mL; Refill: 3  2. Vitamin D deficiency At goal.   Plan: Continue to take prescription Vitamin D '@50'$ ,000 IU every week as prescribed.  Follow-up for routine testing of Vitamin D, at least 2-3 times per year to avoid over-replacement. We will plan on rechecking Kathryn Chan's labs in 3 months.  Lab Results  Component Value Date   VD25OH 51.0 03/04/2021   VD25OH 25.6 (L) 10/23/2020   VD25OH 34.5 10/16/2019   - Vitamin D, Ergocalciferol, (DRISDOL) 1.25 MG  (50000 UNIT) CAPS capsule; Take 1 capsule (50,000 Units total) by mouth every 7 (seven) days.  Dispense: 12 capsule; Refill: 0  3. Drug-induced constipation Counseling: Getting to Good Bowel Health: Your goal is to have one soft bowel movement each day. Drink at least 8 glasses of water each day. Eat plenty of fiber (goal is over 30 grams each day). It is best to get most of your fiber from dietary sources which includes leafy green vegetables, fresh fruit, and whole grains. You may need to add fiber with the help of OTC fiber supplements. These include Metamucil, Citrucel, and Benefiber. If you are still having trouble, try adding an osmotic laxative such as Miralax. If all of these changes do not work, Cabin [employer].   4. Obesity, with current BMI 55.8  Course: Ineze is currently in the action stage of change. As such, her goal is to continue with weight loss efforts.   Nutrition goals: She has agreed to the Category 4 Plan.   Exercise goals: As is.  Behavioral modification strategies: increasing lean protein intake, decreasing simple carbohydrates, increasing vegetables, and increasing water intake.  [name] has agreed to follow-up with our clinic in 4 weeks. She was informed of the importance of frequent follow-up visits to maximize her success with intensive lifestyle modifications for her multiple health conditions.   Objective:   Blood pressure 123/68, pulse (!) 55, temperature 97.9 F (36.6 C), temperature source Oral, height '5\' 7"'$  (1.702 m), weight (!) 356 lb (161.5 kg), SpO2 100 %. Body mass index is 55.76  kg/m.  General: Cooperative, alert, well developed, in no acute distress. HEENT: Conjunctivae and lids unremarkable. Cardiovascular: Regular rhythm.  Lungs: Normal work of breathing. Neurologic: No focal deficits.   Lab Results  Component Value Date   CREATININE 0.65 03/04/2021   BUN 7 03/04/2021   NA 138 03/04/2021   K 4.4 03/04/2021   CL 100 03/04/2021    CO2 23 03/04/2021   Lab Results  Component Value Date   ALT 32 03/04/2021   AST 29 03/04/2021   ALKPHOS 65 03/04/2021   BILITOT 0.2 03/04/2021   Lab Results  Component Value Date   HGBA1C 5.4 03/04/2021   HGBA1C 6.3 (H) 10/23/2020   HGBA1C 6.1 (H) 10/16/2019   HGBA1C 6.1 (H) 01/25/2018   HGBA1C 6.0 (H) 04/04/2017   Lab Results  Component Value Date   INSULIN 32.9 (H) 03/04/2021   INSULIN 93.3 (H) 10/23/2020   INSULIN 86.7 (H) 10/16/2019   Lab Results  Component Value Date   TSH 2.030 10/23/2020   Lab Results  Component Value Date   CHOL 110 03/04/2021   HDL 39 (L) 03/04/2021   LDLCALC 51 03/04/2021   TRIG 106 03/04/2021   CHOLHDL 2.8 03/04/2021   Lab Results  Component Value Date   VD25OH 51.0 03/04/2021   VD25OH 25.6 (L) 10/23/2020   VD25OH 34.5 10/16/2019   Lab Results  Component Value Date   WBC 6.6 03/04/2021   HGB 13.3 03/04/2021   HCT 41.3 03/04/2021   MCV 93 03/04/2021   PLT 272 03/04/2021   Lab Results  Component Value Date   IRON 65 10/23/2020   TIBC 319 10/23/2020   FERRITIN 94 10/23/2020   Attestation Statements:   Reviewed by clinician on day of visit: allergies, medications, problem list, medical history, surgical history, family history, social history, and previous encounter notes.  I, Wyatt Haste, LPN, am acting as Location manager for [employer], DO.  I have reviewed the above documentation for accuracy and completeness, and I agree with the above. -  Briscoe Deutscher, DO, MS, FAAFP, DABOM - Family and Bariatric Medicine.

## 2021-08-12 ENCOUNTER — Other Ambulatory Visit (HOSPITAL_COMMUNITY): Payer: Self-pay

## 2021-08-14 ENCOUNTER — Telehealth: Payer: BC Managed Care – PPO | Admitting: Emergency Medicine

## 2021-08-14 DIAGNOSIS — N3000 Acute cystitis without hematuria: Secondary | ICD-10-CM | POA: Diagnosis not present

## 2021-08-14 MED ORDER — CEPHALEXIN 500 MG PO CAPS: 500.0000 mg | ORAL_CAPSULE | Freq: Two times a day (BID) | ORAL | 0 refills | Status: AC

## 2021-08-14 NOTE — Progress Notes (Signed)
E-Visit for Urinary Problems ? ?We are sorry that you are not feeling well.  Here is how we plan to help! ? ?Based on what you shared with me it looks like you most likely have a simple urinary tract infection. ? ?A UTI (Urinary Tract Infection) is a bacterial infection of the bladder. ? ?Most cases of urinary tract infections are simple to treat but a key part of your care is to encourage you to drink plenty of fluids and watch your symptoms carefully. ? ?I have prescribed Keflex 500 mg twice a day for 7 days.  Your symptoms should gradually improve. Call us if the burning in your urine worsens, you develop worsening fever, back pain or pelvic pain or if your symptoms do not resolve after completing the antibiotic. ? ?Urinary tract infections can be prevented by drinking plenty of water to keep your body hydrated.  Also be sure when you wipe, wipe from front to back and don't hold it in!  If possible, empty your bladder every 4 hours. ? ?HOME CARE ?Drink plenty of fluids ?Compete the full course of the antibiotics even if the symptoms resolve ?Remember, when you need to go?go. Holding in your urine can increase the likelihood of getting a UTI! ?GET HELP RIGHT AWAY IF: ?You cannot urinate ?You get a high fever ?Worsening back pain occurs ?You see blood in your urine ?You feel sick to your stomach or throw up ?You feel like you are going to pass out ? ?MAKE SURE YOU  ?Understand these instructions. ?Will watch your condition. ?Will get help right away if you are not doing well or get worse. ? ? ?Thank you for choosing an e-visit. ? ?Your e-visit answers were reviewed by a board certified advanced clinical practitioner to complete your personal care plan. Depending upon the condition, your plan could have included both over the counter or prescription medications. ? ?Please review your pharmacy choice. Make sure the pharmacy is open so you can pick up prescription now. If there is a problem, you may contact your  provider through MyChart messaging and have the prescription routed to another pharmacy.  Your safety is important to us. If you have drug allergies check your prescription carefully.  ? ?For the next 24 hours you can use MyChart to ask questions about today's visit, request a non-urgent call back, or ask for a work or school excuse. ?You will get an email in the next two days asking about your experience. I hope that your e-visit has been valuable and will speed your recovery. ? ?I have spent 5 minutes in review of e-visit questionnaire, review and updating patient chart, medical decision making and response to patient.  ? ?Lakin Rhine, PhD, FNP-BC ?  ?

## 2021-08-18 ENCOUNTER — Encounter: Payer: BC Managed Care – PPO | Admitting: Adult Health

## 2021-08-24 ENCOUNTER — Encounter: Payer: Self-pay | Admitting: Internal Medicine

## 2021-08-25 ENCOUNTER — Telehealth (INDEPENDENT_AMBULATORY_CARE_PROVIDER_SITE_OTHER): Payer: Self-pay | Admitting: Family Medicine

## 2021-08-25 NOTE — Telephone Encounter (Signed)
Pt contacted and transferred to receptionist to schedule appointment with [facility].

## 2021-08-25 NOTE — Telephone Encounter (Signed)
Pt needs to be seen for refills, TH with dawn ok if she would like

## 2021-08-25 NOTE — Progress Notes (Signed)
?TeleHealth Visit:  ?This visit was completed with telemedicine (audio/video) technology. ?Patt has verbally consented to this TeleHealth visit. The patient is located at home, the provider is located at home. The participants in this visit include the listed provider and patient. The visit was conducted today via MyChart video. ? ?OBESITY ?Kathryn Chan is here to discuss her progress with her obesity treatment plan along with follow-up of her obesity related diagnoses.  ? ?Today's visit was # 16 ?Starting weight: 471 lbs ?Starting date: 10/06/2019 ?Weight at last in office visit: 356 lbs on 07/08/21 ?Total weight loss: 115 lbs at last in office visit on 07/08/21. ?Today's reported weight: 339.6 lbs  ? ?Nutrition Plan: the Category 4 Plan.  ?Hunger is well controlled. Cravings are well controlled.  ?Current exercise: walks 30 minutes for at lunch daily. Currently 4000-4500 steps per day with goal of 97353 steps /day.  ? ?Interim History: Kathryn Chan is doing an excellent job with her plan.  Using her provided weight today she is down 132 pounds overall! ?She is happy with the category 4 plan.  She generally has a protein shake for breakfast with a banana or almonds.  Midmorning she has [place] yogurt.  Lunch is a sandwich with 4 ounces of meat.  She drinks about a gallon of water per day. ? ?She is planning to follow-up with Dr. Juleen China at her new clinic. ? ?Assessment/Plan:  ?1. Prediabetes ?Kathryn Chan has a diagnosis of prediabetes based on her elevated HgA1c. ?She denies polyphagia. ?Medication(s): Mounjaro 12.5 mg weekly. Denies side effects. ?Lab Results  ?Component Value Date  ? HGBA1C 5.4 03/04/2021  ? ?Lab Results  ?Component Value Date  ? INSULIN 32.9 (H) 03/04/2021  ? INSULIN 93.3 (H) 10/23/2020  ? INSULIN 86.7 (H) 10/16/2019  ? ? ?Plan: ?Continue Mounjaro 12.5 mg daily. ? ?2. Bilateral lower extremity edema ?Very well controlled with Lasix 20 mg daily.  She also takes potassium.  He has been on this combination  for over a year.  Potassium level is stable with last value 4.4 in November 2022. ? ?Plan: ?Refill Lasix 20 mg daily as needed ?Refill potassium 20 mEq daily as needed. ? ?3. Hypertension ?Hypertension well controlled.  Home BPs run 120s over 70s. ?Medication(s): Lisinopril 20 mg daily. ? ?BP Readings from Last 3 Encounters:  ?07/08/21 123/68  ?06/03/21 111/68  ?05/06/21 100/60  ? ?Lab Results  ?Component Value Date  ? CREATININE 0.65 03/04/2021  ? CREATININE 0.66 10/23/2020  ? CREATININE 0.75 10/16/2019  ? ? ?Plan: ?Current treatment plan is effective, no change in therapy.Marland Kitchen ?Refill lisinopril 20 mg daily. ? ?4. Obesity: Current BMI 55.8 ?Kathryn Chan is currently in the action stage of change. As such, her goal is to continue with weight loss efforts.  ?She has agreed to the Category 4 Plan.  ? ?Exercise goals: continue current regimen. Work towards 10000 steps/day. ? ?Behavioral modification strategies: meal planning and cooking strategies and planning for success. ? ?She is planning on seeing Dr. Juleen China at her new clinic. ? ?No orders of the defined types were placed in this encounter. ? ? ?Medications Discontinued During This Encounter  ?Medication Reason  ? furosemide (LASIX) 20 MG tablet Reorder  ? potassium chloride SA (KLOR-CON) 20 MEQ tablet Reorder  ? lisinopril (ZESTRIL) 20 MG tablet Reorder  ?  ? ?Meds ordered this encounter  ?Medications  ? furosemide (LASIX) 20 MG tablet  ?  Sig: Take 1 tablet (20 mg total) by mouth daily as needed.  ?  Dispense:  90 tablet  ?  Refill:  0  ?  Order Specific Question:   Supervising Provider  ?  Answer:   Dennard Nip D [[id number]]  ? potassium chloride SA (KLOR-CON M) 20 MEQ tablet  ?  Sig: Take 1 tablet (20 mEq total) by mouth daily as needed. TAKE WITH LASIX  ?  Dispense:  90 tablet  ?  Refill:  0  ?  Order Specific Question:   Supervising Provider  ?  Answer:   Dennard Nip D [[id number]]  ? lisinopril (ZESTRIL) 20 MG tablet  ?  Sig: Take 1 tablet (20 mg total) by mouth  daily. for blood pressure  ?  Dispense:  90 tablet  ?  Refill:  0  ?  Order Specific Question:   Supervising Provider  ?  Answer:   Dennard Nip D [[id number]]  ?   ? ?Objective:  ? ?VITALS: Per patient if applicable, see vitals. ?GENERAL: Alert and in no acute distress. ?CARDIOPULMONARY: No increased WOB. Speaking in clear sentences.  ?PSYCH: Pleasant and cooperative. Speech normal rate and rhythm. Affect is appropriate. Insight and judgement are appropriate. Attention is focused, linear, and appropriate.  ?NEURO: Oriented as arrived to appointment on time with no prompting.  ? ?Lab Results  ?Component Value Date  ? CREATININE 0.65 03/04/2021  ? BUN 7 03/04/2021  ? NA 138 03/04/2021  ? K 4.4 03/04/2021  ? CL 100 03/04/2021  ? CO2 23 03/04/2021  ? ?Lab Results  ?Component Value Date  ? ALT 32 03/04/2021  ? AST 29 03/04/2021  ? ALKPHOS 65 03/04/2021  ? BILITOT 0.2 03/04/2021  ? ?Lab Results  ?Component Value Date  ? HGBA1C 5.4 03/04/2021  ? HGBA1C 6.3 (H) 10/23/2020  ? HGBA1C 6.1 (H) 10/16/2019  ? HGBA1C 6.1 (H) 01/25/2018  ? HGBA1C 6.0 (H) 04/04/2017  ? ?Lab Results  ?Component Value Date  ? INSULIN 32.9 (H) 03/04/2021  ? INSULIN 93.3 (H) 10/23/2020  ? INSULIN 86.7 (H) 10/16/2019  ? ?Lab Results  ?Component Value Date  ? TSH 2.030 10/23/2020  ? ?Lab Results  ?Component Value Date  ? CHOL 110 03/04/2021  ? HDL 39 (L) 03/04/2021  ? [place] 51 03/04/2021  ? TRIG 106 03/04/2021  ? CHOLHDL 2.8 03/04/2021  ? ?Lab Results  ?Component Value Date  ? WBC 6.6 03/04/2021  ? HGB 13.3 03/04/2021  ? HCT 41.3 03/04/2021  ? MCV 93 03/04/2021  ? PLT 272 03/04/2021  ? ?Lab Results  ?Component Value Date  ? IRON 65 10/23/2020  ? TIBC 319 10/23/2020  ? FERRITIN 94 10/23/2020  ? ?Lab Results  ?Component Value Date  ? VD25OH 51.0 03/04/2021  ? VD25OH 25.6 (L) 10/23/2020  ? VD25OH 34.5 10/16/2019  ? ? ?Attestation Statements:  ? ?Reviewed by clinician on day of visit: allergies, medications, problem list, medical history, surgical history,  family history, social history, and previous encounter notes. ? ?

## 2021-08-25 NOTE — Telephone Encounter (Signed)
LAST APPOINTMENT DATE: 07/08/21 ?NEXT APPOINTMENT DATE: NONE ? ? ?[facility] [place] ?[employer] ?[place] [address] ?Phone: [phone] Fax: [phone] ? ?[facility] [place], [place] - [address] ?[address] ?[place]  [address] ?Phone: [phone] Fax: [phone] ? ?[facility] ?[address] ?[place] [address] ?Phone: [phone] Fax: [phone] ? ?Patient is requesting a refill of the following medications: ?No prescriptions requested or ordered in this encounter ? ? ?Date last filled: Lisinopril-05/28/21 ?                Lasix and Potassium - 11/10/20 ?Previously prescribed by Juleen China ? ?Lab Results ?     Component                Value               Date                 ?     HGBA1C                   5.4                 03/04/2021           ?     HGBA1C                   6.3 (H)             10/23/2020           ?     HGBA1C                   6.1 (H)             10/16/2019           ?Lab Results ?     Component                Value               Date                 ?     [place]                  51                  03/04/2021           ?     CREATININE               0.65                03/04/2021           ?Lab Results ?     Component                Value               Date                 ?     VD25OH                   51.0                03/04/2021           ?     [place]  25.6 (L)            10/23/2020           ?     VD25OH                   34.5                10/16/2019           ? ?BP Readings from Last 3 Encounters: ?07/08/21 : 123/68 ?06/03/21 : 111/68 ?05/06/21 : 100/60 ?

## 2021-08-25 NOTE — Telephone Encounter (Signed)
Patient requesting refills on Lisinopril, Lasix, and potassium chloride. ?

## 2021-08-27 ENCOUNTER — Telehealth (INDEPENDENT_AMBULATORY_CARE_PROVIDER_SITE_OTHER): Payer: BC Managed Care – PPO | Admitting: Family Medicine

## 2021-08-27 ENCOUNTER — Encounter (INDEPENDENT_AMBULATORY_CARE_PROVIDER_SITE_OTHER): Payer: Self-pay | Admitting: Family Medicine

## 2021-08-27 DIAGNOSIS — R600 Localized edema: Secondary | ICD-10-CM | POA: Diagnosis not present

## 2021-08-27 DIAGNOSIS — I10 Essential (primary) hypertension: Secondary | ICD-10-CM

## 2021-08-27 DIAGNOSIS — E669 Obesity, unspecified: Secondary | ICD-10-CM

## 2021-08-27 DIAGNOSIS — Z6841 Body Mass Index (BMI) 40.0 and over, adult: Secondary | ICD-10-CM | POA: Diagnosis not present

## 2021-08-27 MED ORDER — FUROSEMIDE 20 MG PO TABS: 20.0000 mg | ORAL_TABLET | Freq: Every day | ORAL | 0 refills | Status: DC | PRN

## 2021-08-27 MED ORDER — POTASSIUM CHLORIDE CRYS ER 20 MEQ PO TBCR: 20.0000 meq | EXTENDED_RELEASE_TABLET | Freq: Every day | ORAL | 0 refills | Status: DC | PRN

## 2021-08-27 MED ORDER — LISINOPRIL 20 MG PO TABS: 20.0000 mg | ORAL_TABLET | Freq: Every day | ORAL | 0 refills | Status: DC

## 2021-09-10 ENCOUNTER — Encounter (INDEPENDENT_AMBULATORY_CARE_PROVIDER_SITE_OTHER): Payer: Self-pay | Admitting: Family Medicine

## 2021-09-10 ENCOUNTER — Other Ambulatory Visit (HOSPITAL_COMMUNITY): Payer: Self-pay

## 2021-09-10 DIAGNOSIS — R7301 Impaired fasting glucose: Secondary | ICD-10-CM

## 2021-09-10 DIAGNOSIS — E8881 Metabolic syndrome: Secondary | ICD-10-CM

## 2021-09-11 ENCOUNTER — Other Ambulatory Visit (HOSPITAL_COMMUNITY): Payer: Self-pay

## 2021-09-14 MED ORDER — TIRZEPATIDE 12.5 MG/0.5ML ~~LOC~~ SOAJ
12.5000 mg | SUBCUTANEOUS | 3 refills | Status: DC
  Filled 2021-09-14: qty 6, 84d supply, fill #0
  Filled 2021-09-18 (×2): qty 2, 28d supply, fill #0
  Filled 2021-09-30 – 2021-10-15 (×3): qty 6, 84d supply, fill #0
  Filled 2021-10-22: qty 2, 28d supply, fill #0
  Filled 2021-10-22: qty 6, 84d supply, fill #0
  Filled 2022-07-09: qty 2, 28d supply, fill #1

## 2021-09-14 MED ORDER — TIRZEPATIDE 12.5 MG/0.5ML ~~LOC~~ SOAJ: 12.5000 mg | SUBCUTANEOUS | 3 refills | Status: DC

## 2021-09-15 ENCOUNTER — Telehealth (INDEPENDENT_AMBULATORY_CARE_PROVIDER_SITE_OTHER): Payer: Self-pay | Admitting: Family Medicine

## 2021-09-15 ENCOUNTER — Encounter (INDEPENDENT_AMBULATORY_CARE_PROVIDER_SITE_OTHER): Payer: Self-pay

## 2021-09-15 ENCOUNTER — Other Ambulatory Visit (HOSPITAL_COMMUNITY): Payer: Self-pay

## 2021-09-15 NOTE — Telephone Encounter (Signed)
Dawn [employer] - Prior authorization denied for [employer]. Per insurance: Patient does not have type 2 diabetes. Patient sent denial message via mychart.

## 2021-09-16 ENCOUNTER — Encounter (INDEPENDENT_AMBULATORY_CARE_PROVIDER_SITE_OTHER): Payer: Self-pay | Admitting: Family Medicine

## 2021-09-16 NOTE — Telephone Encounter (Signed)
FYI

## 2021-09-16 NOTE — Telephone Encounter (Signed)
Please review

## 2021-09-18 ENCOUNTER — Other Ambulatory Visit (HOSPITAL_COMMUNITY): Payer: Self-pay

## 2021-09-21 DIAGNOSIS — E119 Type 2 diabetes mellitus without complications: Secondary | ICD-10-CM | POA: Diagnosis not present

## 2021-09-21 DIAGNOSIS — R600 Localized edema: Secondary | ICD-10-CM | POA: Diagnosis not present

## 2021-09-21 DIAGNOSIS — Z9189 Other specified personal risk factors, not elsewhere classified: Secondary | ICD-10-CM | POA: Diagnosis not present

## 2021-09-21 DIAGNOSIS — I10 Essential (primary) hypertension: Secondary | ICD-10-CM | POA: Diagnosis not present

## 2021-09-21 DIAGNOSIS — E559 Vitamin D deficiency, unspecified: Secondary | ICD-10-CM | POA: Diagnosis not present

## 2021-09-30 ENCOUNTER — Other Ambulatory Visit (HOSPITAL_COMMUNITY): Payer: Self-pay

## 2021-10-15 ENCOUNTER — Other Ambulatory Visit (HOSPITAL_COMMUNITY): Payer: Self-pay

## 2021-10-22 ENCOUNTER — Other Ambulatory Visit (HOSPITAL_COMMUNITY): Payer: Self-pay

## 2021-10-27 ENCOUNTER — Other Ambulatory Visit (HOSPITAL_COMMUNITY): Payer: Self-pay

## 2021-11-02 DIAGNOSIS — I10 Essential (primary) hypertension: Secondary | ICD-10-CM | POA: Diagnosis not present

## 2021-11-02 DIAGNOSIS — R600 Localized edema: Secondary | ICD-10-CM | POA: Diagnosis not present

## 2021-11-02 DIAGNOSIS — E118 Type 2 diabetes mellitus with unspecified complications: Secondary | ICD-10-CM | POA: Diagnosis not present

## 2021-11-25 ENCOUNTER — Encounter (INDEPENDENT_AMBULATORY_CARE_PROVIDER_SITE_OTHER): Payer: Self-pay

## 2021-12-24 DIAGNOSIS — E118 Type 2 diabetes mellitus with unspecified complications: Secondary | ICD-10-CM | POA: Diagnosis not present

## 2021-12-24 DIAGNOSIS — R600 Localized edema: Secondary | ICD-10-CM | POA: Diagnosis not present

## 2021-12-24 DIAGNOSIS — I10 Essential (primary) hypertension: Secondary | ICD-10-CM | POA: Diagnosis not present

## 2022-02-03 DIAGNOSIS — R600 Localized edema: Secondary | ICD-10-CM | POA: Diagnosis not present

## 2022-02-03 DIAGNOSIS — E118 Type 2 diabetes mellitus with unspecified complications: Secondary | ICD-10-CM | POA: Diagnosis not present

## 2022-02-03 DIAGNOSIS — E559 Vitamin D deficiency, unspecified: Secondary | ICD-10-CM | POA: Diagnosis not present

## 2022-03-29 DIAGNOSIS — I152 Hypertension secondary to endocrine disorders: Secondary | ICD-10-CM | POA: Diagnosis not present

## 2022-03-29 DIAGNOSIS — E118 Type 2 diabetes mellitus with unspecified complications: Secondary | ICD-10-CM | POA: Diagnosis not present

## 2022-03-29 DIAGNOSIS — E65 Localized adiposity: Secondary | ICD-10-CM | POA: Diagnosis not present

## 2022-03-29 DIAGNOSIS — E559 Vitamin D deficiency, unspecified: Secondary | ICD-10-CM | POA: Diagnosis not present

## 2022-05-06 ENCOUNTER — Telehealth: Payer: BC Managed Care – PPO | Admitting: Physician Assistant

## 2022-05-06 DIAGNOSIS — R6889 Other general symptoms and signs: Secondary | ICD-10-CM

## 2022-05-06 MED ORDER — PROMETHAZINE-DM 6.25-15 MG/5ML PO SYRP: 5.0000 mL | ORAL_SOLUTION | Freq: Four times a day (QID) | ORAL | 0 refills | Status: DC | PRN

## 2022-05-06 MED ORDER — OSELTAMIVIR PHOSPHATE 75 MG PO CAPS: 75.0000 mg | ORAL_CAPSULE | Freq: Two times a day (BID) | ORAL | 0 refills | Status: DC

## 2022-05-06 NOTE — Progress Notes (Signed)
E visit for Flu like symptoms   We are sorry that you are not feeling well.  Here is how we plan to help! Based on what you have shared with me it looks like you may have a respiratory virus that may be influenza.  Influenza or "the flu" is   an infection caused by a respiratory virus. The flu virus is highly contagious and persons who did not receive their yearly flu vaccination may "catch" the flu from close contact.  We have anti-viral medications to treat the viruses that cause this infection. They are not a "cure" and only shorten the course of the infection. These prescriptions are most effective when they are given within the first 2 days of "flu" symptoms. Antiviral medication are indicated if you have a high risk of complications from the flu. You should  also consider an antiviral medication if you are in close contact with someone who is at risk. These medications can help patients avoid complications from the flu  but have side effects that you should know. Possible side effects from Tamiflu or oseltamivir include nausea, vomiting, diarrhea, dizziness, headaches, eye redness, sleep problems or other respiratory symptoms. You should not take Tamiflu if you have an allergy to oseltamivir or any to the ingredients in Tamiflu.  Based upon your symptoms and potential risk factors I have prescribed Oseltamivir (Tamiflu).  It has been sent to your designated pharmacy.  You will take one 75 mg capsule orally twice a day for the next 5 days. And Promethazine DM cough syrup for cough.   ANYONE WHO HAS FLU SYMPTOMS SHOULD: Stay home. The flu is highly contagious and going out or to work exposes others! Be sure to drink plenty of fluids. Water is fine as well as fruit juices, sodas and electrolyte beverages. You may want to stay away from caffeine or alcohol. If you are nauseated, try taking small sips of liquids. How do you know if you are getting enough fluid? Your urine should be a pale yellow or  almost colorless. Get rest. Taking a steamy shower or using a humidifier may help nasal congestion and ease sore throat pain. Using a saline nasal spray works much the same way. Cough drops, hard candies and sore throat lozenges may ease your cough. Line up a caregiver. Have someone check on you regularly.   GET HELP RIGHT AWAY IF: You cannot keep down liquids or your medications. You become short of breath Your fell like you are going to pass out or loose consciousness. Your symptoms persist after you have completed your treatment plan MAKE SURE YOU  Understand these instructions. Will watch your condition. Will get help right away if you are not doing well or get worse.  Your e-visit answers were reviewed by a board certified advanced clinical practitioner to complete your personal care plan.  Depending on the condition, your plan could have included both over the counter or prescription medications.  If there is a problem please reply  once you have received a response from your provider.  Your safety is important to [place].  If you have drug allergies check your prescription carefully.    You can use MyChart to ask questions about today's visit, request a non-urgent call back, or ask for a work or school excuse for 24 hours related to this e-Visit. If it has been greater than 24 hours you will need to follow up with your provider, or enter a new e-Visit to address those concerns.  You will get an e-mail in the next two days asking about your experience.  I hope that your e-visit has been valuable and will speed your recovery. Thank you for using e-visits.  I have spent 5 minutes in review of e-visit questionnaire, review and updating patient chart, medical decision making and response to patient.   Mar Daring, PA-C

## 2022-05-31 DIAGNOSIS — E118 Type 2 diabetes mellitus with unspecified complications: Secondary | ICD-10-CM | POA: Diagnosis not present

## 2022-05-31 DIAGNOSIS — E538 Deficiency of other specified B group vitamins: Secondary | ICD-10-CM | POA: Diagnosis not present

## 2022-05-31 DIAGNOSIS — E559 Vitamin D deficiency, unspecified: Secondary | ICD-10-CM | POA: Diagnosis not present

## 2022-05-31 DIAGNOSIS — I152 Hypertension secondary to endocrine disorders: Secondary | ICD-10-CM | POA: Diagnosis not present

## 2022-06-25 NOTE — Progress Notes (Deleted)
37 y.o. G0P0000 Single White or Caucasian Not Hispanic or Latino female here for NEW GYN/annual exam.      No LMP recorded.          Sexually active: {yes no:314532}  The current method of family planning is {contraception:315051}.    Exercising: {yes no:314532}  {types:19826} Smoker:  {YES NO:22349}  Health Maintenance: Pap:  2011 PCP History of abnormal Pap:  {YES NO:22349} MMG:  *** BMD:   *** Colonoscopy: *** TDaP:  *** Gardasil: ***   reports that she has never smoked. She has never used smokeless tobacco. She reports that she does not drink alcohol and does not use drugs.  Past Medical History:  Diagnosis Date   Back pain    Hypertension    Hypomagnesemia    Lower extremity edema    Obesity, morbid, BMI 50 or higher (HCC)    Prediabetes    SOB (shortness of breath)    Vitamin D deficiency     Past Surgical History:  Procedure Laterality Date   CHOLECYSTECTOMY      Current Outpatient Medications  Medication Sig Dispense Refill   furosemide (LASIX) 20 MG tablet Take 1 tablet (20 mg total) by mouth daily as needed. 90 tablet 0   lisinopril (ZESTRIL) 20 MG tablet Take 1 tablet (20 mg total) by mouth daily. for blood pressure 90 tablet 0   oseltamivir (TAMIFLU) 75 MG capsule Take 1 capsule (75 mg total) by mouth 2 (two) times daily. 10 capsule 0   potassium chloride SA (KLOR-CON M) 20 MEQ tablet Take 1 tablet (20 mEq total) by mouth daily as needed. TAKE WITH LASIX 90 tablet 0   Prenatal Vit-Fe Fumarate-FA (MULTIVITAMIN-PRENATAL) 27-0.8 MG TABS tablet Take 1 tablet by mouth daily at 12 noon.     promethazine-dextromethorphan (PROMETHAZINE-DM) 6.25-15 MG/5ML syrup Take 5 mLs by mouth 4 (four) times daily as needed. 118 mL 0   tirzepatide (MOUNJARO) 12.5 MG/0.5ML Pen Inject 12.5 mg into the skin once a week. 6 mL 3   Vitamin D, Ergocalciferol, (DRISDOL) 1.25 MG (50000 UNIT) CAPS capsule Take 1 capsule (50,000 Units total) by mouth every 7 (seven) days. 12 capsule 0   No  current facility-administered medications for this visit.    Family History  Problem Relation Age of Onset   Hypertension Mother    Thyroid disease Mother    Liver disease Mother    Alcoholism Mother    Obesity Mother    Hyperlipidemia Father    Diabetes Paternal Grandfather    Heart disease Paternal Grandfather    Heart disease Cousin 67   Heart disease Cousin 24    Review of Systems  Exam:   There were no vitals taken for this visit.  Weight change: '@WEIGHTCHANGE'$ @ Height:      Ht Readings from Last 3 Encounters:  07/08/21 '5\' 7"'$  (1.702 m)  06/03/21 '5\' 7"'$  (1.702 m)  05/06/21 '5\' 7"'$  (1.702 m)    General appearance: alert, cooperative and appears stated age Head: Normocephalic, without obvious abnormality, atraumatic Neck: no adenopathy, supple, symmetrical, trachea midline and thyroid {CHL AMB PHY EX THYROID NORM DEFAULT:[phone]::"normal to inspection and palpation"} Lungs: clear to auscultation bilaterally Cardiovascular: regular rate and rhythm Breasts: {Exam; breast:13139::"normal appearance, no masses or tenderness"} Abdomen: soft, non-tender; non distended,  no masses,  no organomegaly Extremities: extremities normal, atraumatic, no cyanosis or edema Skin: Skin color, texture, turgor normal. No rashes or lesions Lymph nodes: Cervical, supraclavicular, and axillary nodes normal. No abnormal inguinal nodes palpated  Neurologic: Grossly normal   Pelvic: External genitalia:  no lesions              Urethra:  normal appearing urethra with no masses, tenderness or lesions              Bartholins and Skenes: normal                 Vagina: normal appearing vagina with normal color and discharge, no lesions              Cervix: {CHL AMB PHY EX CERVIX NORM DEFAULT:[id number]::"no lesions"}               Bimanual Exam:  Uterus:  {CHL AMB PHY EX UTERUS NORM DEFAULT:[phone]::"normal size, contour, position, consistency, mobility, non-tender"}              Adnexa: {CHL AMB  PHY EX ADNEXA NO MASS DEFAULT:[phone]::"no mass, fullness, tenderness"}               Rectovaginal: Confirms               Anus:  normal sphincter tone, no lesions  *** chaperoned for the exam.  A:  Well Woman with normal exam  P:

## 2022-06-30 ENCOUNTER — Other Ambulatory Visit (HOSPITAL_COMMUNITY): Payer: Self-pay

## 2022-06-30 ENCOUNTER — Other Ambulatory Visit: Payer: Self-pay

## 2022-06-30 MED ORDER — MOUNJARO 15 MG/0.5ML ~~LOC~~ SOAJ
15.0000 mg | SUBCUTANEOUS | 0 refills | Status: DC
  Filled 2022-06-30: qty 6, 84d supply, fill #0

## 2022-07-01 ENCOUNTER — Other Ambulatory Visit (HOSPITAL_COMMUNITY): Payer: Self-pay

## 2022-07-01 MED ORDER — MOUNJARO 15 MG/0.5ML ~~LOC~~ SOAJ
15.0000 mg | SUBCUTANEOUS | 1 refills | Status: DC
  Filled 2022-07-01: qty 6, 84d supply, fill #0

## 2022-07-05 ENCOUNTER — Other Ambulatory Visit (HOSPITAL_COMMUNITY): Payer: Self-pay

## 2022-07-06 ENCOUNTER — Other Ambulatory Visit (HOSPITAL_COMMUNITY): Payer: Self-pay

## 2022-07-08 ENCOUNTER — Other Ambulatory Visit (HOSPITAL_COMMUNITY): Payer: Self-pay

## 2022-07-09 ENCOUNTER — Encounter: Payer: BC Managed Care – PPO | Admitting: Obstetrics and Gynecology

## 2022-07-09 ENCOUNTER — Other Ambulatory Visit (HOSPITAL_COMMUNITY): Payer: Self-pay

## 2022-07-09 ENCOUNTER — Other Ambulatory Visit (HOSPITAL_BASED_OUTPATIENT_CLINIC_OR_DEPARTMENT_OTHER): Payer: Self-pay

## 2022-07-09 MED ORDER — MOUNJARO 10 MG/0.5ML ~~LOC~~ SOAJ
10.0000 mg | SUBCUTANEOUS | 1 refills | Status: DC
  Filled 2022-07-09 (×2): qty 2, 28d supply, fill #0

## 2022-07-09 MED ORDER — MOUNJARO 12.5 MG/0.5ML ~~LOC~~ SOAJ
12.5000 mg | SUBCUTANEOUS | 1 refills | Status: DC
  Filled 2022-07-09: qty 2, 28d supply, fill #0

## 2022-07-26 DIAGNOSIS — M25521 Pain in right elbow: Secondary | ICD-10-CM | POA: Diagnosis not present

## 2022-08-03 DIAGNOSIS — E118 Type 2 diabetes mellitus with unspecified complications: Secondary | ICD-10-CM | POA: Diagnosis not present

## 2022-08-03 DIAGNOSIS — E538 Deficiency of other specified B group vitamins: Secondary | ICD-10-CM | POA: Diagnosis not present

## 2022-08-03 DIAGNOSIS — I152 Hypertension secondary to endocrine disorders: Secondary | ICD-10-CM | POA: Diagnosis not present

## 2022-08-03 DIAGNOSIS — E559 Vitamin D deficiency, unspecified: Secondary | ICD-10-CM | POA: Diagnosis not present

## 2022-09-20 ENCOUNTER — Encounter: Payer: Self-pay | Admitting: Physician Assistant

## 2022-09-20 ENCOUNTER — Ambulatory Visit: Payer: BC Managed Care – PPO | Admitting: Physician Assistant

## 2022-09-20 VITALS — BP 106/70 | HR 50 | Temp 97.8°F | Ht 66.0 in | Wt 274.0 lb

## 2022-09-20 DIAGNOSIS — Z23 Encounter for immunization: Secondary | ICD-10-CM

## 2022-09-20 DIAGNOSIS — I10 Essential (primary) hypertension: Secondary | ICD-10-CM | POA: Diagnosis not present

## 2022-09-20 DIAGNOSIS — R7303 Prediabetes: Secondary | ICD-10-CM

## 2022-09-20 DIAGNOSIS — E669 Obesity, unspecified: Secondary | ICD-10-CM | POA: Diagnosis not present

## 2022-09-20 DIAGNOSIS — E559 Vitamin D deficiency, unspecified: Secondary | ICD-10-CM | POA: Diagnosis not present

## 2022-09-20 NOTE — Progress Notes (Signed)
Kathryn Chan is a 37 y.o. female here to establish care.  History of Present Illness:   Chief Complaint  Patient presents with   Establish Care    HPI  Obesity: Established care with Dr. Earlene Plater since 10/2020 at [facility], currently following up every 2 months.  Reports a max weight of 480.8 lb in 2022 and was started out on category 4 plan and was put on Mounjaro.  Since then she has lost about 220 lbs.  Was previously on 15 mg but stopped due to indigestion Is currently compliant with 12.5 mg of Mounjaro, still losing weight on this dose.  Has occasional constipation, takes Miralax helps. Overall tolerates Mounjaro. She monitors her A1C at [facility] and reports a reading of 5.1% about a month ago.  Previously she has had A1C readings that borderline diabetes.  She has been staying active and exercising daily. Walks about 45 minutes and averages 5,000 steps on her lunch break Gets about 8,500 steps a day and is working on her goal of 10,000 steps a day.  Maintains healthy diet.   LE edema: She reports bilateral lower extremity edema. This was very significant post-COVID. Currently taking 20 mg of Lasix daily.  Started Lasix at 40 mg and lost about 40 lbs in 2 weeks.  Taking potassium supplements daily   Vit D Deficiency:  She takes 50,000 units of Vit D once a week   HTN Currently taking lisinopril 10 mg. At home blood pressure readings are: not regularly checked. Patient denies chest pain, SOB, blurred vision, dizziness, unusual headaches, lower leg swelling. Patient is compliant with medication. Denies excessive caffeine intake, stimulant usage, excessive alcohol intake, or increase in salt consumption.  BP Readings from Last 3 Encounters:  09/20/22 106/70  07/08/21 123/68  06/03/21 111/68     Past Medical History:  Diagnosis Date   Back pain    Diabetes mellitus without complication (HCC)    Heart murmur    Hypertension    Hypomagnesemia     Lower extremity edema    Obesity, morbid, BMI 50 or higher (HCC)    Prediabetes    SOB (shortness of breath)    Vitamin D deficiency      Social History   Tobacco Use   Smoking status: Never   Smokeless tobacco: Never  Vaping Use   Vaping Use: Never used  Substance Use Topics   Alcohol use: Not Currently    Comment: Rarely   Drug use: Never    Past Surgical History:  Procedure Laterality Date   CHOLECYSTECTOMY      Family History  Problem Relation Age of Onset   Hypertension Mother    Thyroid disease Mother    Liver disease Mother    Alcoholism Mother    Obesity Mother    Hyperlipidemia Father    Breast cancer Paternal Grandmother    Diabetes Paternal Grandfather    Heart disease Paternal Grandfather    Heart attack Cousin 35   Heart attack Cousin 32    Allergies  Allergen Reactions   Phentermine     Hyperkalemia    Current Medications:   Current Outpatient Medications:    aspirin EC 81 MG tablet, Take 1 tablet by mouth daily., Disp: , Rfl:    furosemide (LASIX) 20 MG tablet, Take 1 tablet (20 mg total) by mouth daily as needed., Disp: 90 tablet, Rfl: 0   lisinopril (ZESTRIL) 20 MG tablet, Take 1 tablet (20 mg total) by mouth  daily. for blood pressure (Patient taking differently: Take 10 mg by mouth daily. for blood pressure), Disp: 90 tablet, Rfl: 0   omeprazole (PRILOSEC OTC) 20 MG tablet, Take 20 mg by mouth as needed., Disp: , Rfl:    polyethylene glycol powder (MIRALAX) 17 GM/SCOOP powder, Take 1 Container by mouth every other day., Disp: , Rfl:    potassium chloride SA (KLOR-CON M) 20 MEQ tablet, Take 1 tablet (20 mEq total) by mouth daily as needed. TAKE WITH LASIX, Disp: 90 tablet, Rfl: 0   Prenatal Vit-Fe Fumarate-FA (MULTIVITAMIN-PRENATAL) 27-0.8 MG TABS tablet, Take 1 tablet by mouth daily at 12 noon., Disp: , Rfl:    Probiotic TBEC, Take 1 capsule by mouth daily., Disp: , Rfl:    Psyllium Fiber 0.52 g CAPS, Take 10 capsules by mouth daily in the  afternoon., Disp: , Rfl:    tirzepatide (MOUNJARO) 12.5 MG/0.5ML Pen, Inject 12.5 mg into the skin once a week., Disp: 6 mL, Rfl: 3   tirzepatide (MOUNJARO) 15 MG/0.5ML Pen, Inject 15 mg into the skin once a week., Disp: 6 mL, Rfl: 0   tirzepatide (MOUNJARO) 15 MG/0.5ML Pen, Inject 15 mg into the skin once a week., Disp: 6 mL, Rfl: 1   Vitamin D, Ergocalciferol, (DRISDOL) 1.25 MG (50000 UNIT) CAPS capsule, Take 1 capsule (50,000 Units total) by mouth every 7 (seven) days., Disp: 12 capsule, Rfl: 0   Zinc 50 MG TABS, Take 1 tablet by mouth every other day., Disp: , Rfl:    Review of Systems:   Review of Systems  Constitutional:  Positive for weight loss.  Cardiovascular:  Positive for leg swelling.    Vitals:   Vitals:   09/20/22 0852  BP: 106/70  Pulse: (!) 50  Temp: 97.8 F (36.6 C)  TempSrc: Temporal  SpO2: 100%  Weight: 274 lb (124.3 kg)  Height: 5\' 6"  (1.676 m)     Body mass index is 44.22 kg/m.  Physical Exam:   Physical Exam Vitals and nursing note reviewed.  Constitutional:      General: She is not in acute distress.    Appearance: She is well-developed. She is not ill-appearing or toxic-appearing.  Cardiovascular:     Rate and Rhythm: Normal rate and regular rhythm.     Pulses: Normal pulses.     Heart sounds: Normal heart sounds, S1 normal and S2 normal.  Pulmonary:     Effort: Pulmonary effort is normal.     Breath sounds: Normal breath sounds.  Skin:    General: Skin is warm and dry.  Neurological:     Mental Status: She is alert.     GCS: GCS eye subscore is 4. GCS verbal subscore is 5. GCS motor subscore is 6.  Psychiatric:        Speech: Speech normal.        Behavior: Behavior normal. Behavior is cooperative.     Assessment and Plan:   Primary hypertension Normotensive This is being closely monitored by Dr Earlene Plater  Continue monitoring with low threshold to decrease if becomes hypotensive/symptomatic  Obesity, unspecified classification,  unspecified obesity type, unspecified whether serious comorbidity present Doing so well with weight loss Continue current management  Vitamin D deficiency Management per weight mgmt  Prediabetes Management per weight mgmt  Need for prophylactic vaccination with combined diphtheria-tetanus-pertussis (DTP) vaccine Completed today  I,Rachel Rivera,acting as a scribe for [employer], PA.,have documented all relevant documentation on the behalf of Jarold Motto, PA,as directed by  Jarold Motto,  PA while in the presence of Jarold Motto, [place].  I, Jarold Motto, [place], have reviewed all documentation for this visit. The documentation on 09/20/22 for the exam, diagnosis, procedures, and orders are all accurate and complete.   Jarold Motto, PA-C

## 2022-09-20 NOTE — Patient Instructions (Signed)
It was great to see you!  Keep up the good work! Your story is very inspiring!  Let's follow-up for pap smear and physical whenever you'd like, sooner if you have concerns.  Take care,  Jarold Motto PA-C

## 2022-10-18 ENCOUNTER — Other Ambulatory Visit (INDEPENDENT_AMBULATORY_CARE_PROVIDER_SITE_OTHER): Payer: Self-pay | Admitting: Family Medicine

## 2022-10-18 DIAGNOSIS — E8881 Metabolic syndrome: Secondary | ICD-10-CM

## 2022-10-18 DIAGNOSIS — R7301 Impaired fasting glucose: Secondary | ICD-10-CM

## 2022-10-30 ENCOUNTER — Other Ambulatory Visit (INDEPENDENT_AMBULATORY_CARE_PROVIDER_SITE_OTHER): Payer: Self-pay | Admitting: Family Medicine

## 2022-10-30 DIAGNOSIS — E8881 Metabolic syndrome: Secondary | ICD-10-CM

## 2022-10-30 DIAGNOSIS — R7301 Impaired fasting glucose: Secondary | ICD-10-CM

## 2022-11-23 DIAGNOSIS — H04123 Dry eye syndrome of bilateral lacrimal glands: Secondary | ICD-10-CM | POA: Diagnosis not present

## 2022-11-23 DIAGNOSIS — R7303 Prediabetes: Secondary | ICD-10-CM | POA: Diagnosis not present

## 2022-11-23 DIAGNOSIS — H0288B Meibomian gland dysfunction left eye, upper and lower eyelids: Secondary | ICD-10-CM | POA: Diagnosis not present

## 2022-11-23 DIAGNOSIS — H0288A Meibomian gland dysfunction right eye, upper and lower eyelids: Secondary | ICD-10-CM | POA: Diagnosis not present

## 2022-12-02 ENCOUNTER — Encounter (INDEPENDENT_AMBULATORY_CARE_PROVIDER_SITE_OTHER): Payer: Self-pay

## 2022-12-28 ENCOUNTER — Encounter: Payer: Self-pay | Admitting: Physician Assistant

## 2022-12-28 ENCOUNTER — Ambulatory Visit
Admission: EM | Admit: 2022-12-28 | Discharge: 2022-12-28 | Disposition: A | Discharge status: Home / Self Care | Payer: BC Managed Care – PPO | Attending: Physician Assistant | Admitting: Physician Assistant

## 2022-12-28 DIAGNOSIS — J069 Acute upper respiratory infection, unspecified: Secondary | ICD-10-CM | POA: Diagnosis not present

## 2022-12-28 DIAGNOSIS — B9789 Other viral agents as the cause of diseases classified elsewhere: Secondary | ICD-10-CM | POA: Insufficient documentation

## 2022-12-28 DIAGNOSIS — Z1152 Encounter for screening for COVID-19: Secondary | ICD-10-CM | POA: Insufficient documentation

## 2022-12-28 NOTE — ED Triage Notes (Signed)
"  On Sunday started with sore throat, then yesterday a lot of congestion and sorethroat today I have a pounding headache". My Coworker is + for COVID19.

## 2022-12-28 NOTE — ED Notes (Signed)
+   at home COVID19 test "but control wasn't showing completely".

## 2022-12-28 NOTE — ED Provider Notes (Signed)
[facility]    CSN: [id number] Arrival date & time: 12/28/22  1704      History   Chief Complaint Chief Complaint  Patient presents with   Sore Throat   Nasal Congestion   COVID19 Testing    Request    HPI Kathryn Chan is a 37 y.o. female.   Patient here today for evaluation of sore throat, congestion, headache that started 2 days ago.  She states that her coworker recently tested positive for COVID.  She has not had any fever.  She does report body aches.  She has taken Tylenol with mild relief.  She does report having a at home COVID test that tested positive but notes that control line was faint however test line was very bold.  The history is provided by the patient.  Sore Throat Associated symptoms include headaches. Pertinent negatives include no abdominal pain and no shortness of breath.    Past Medical History:  Diagnosis Date   Back pain    Diabetes mellitus without complication (HCC)    Heart murmur    Hypertension    Hypomagnesemia    Lower extremity edema    Obesity, morbid, BMI 50 or higher (HCC)    Prediabetes    SOB (shortness of breath)    Vitamin D deficiency     Patient Active Problem List   Diagnosis Date Noted   Prediabetes    Vitamin D deficiency    Hypertension    Obesity     Past Surgical History:  Procedure Laterality Date   CHOLECYSTECTOMY      OB History     Gravida  0   Para  0   Term  0   Preterm  0   AB  0   Living  0      SAB  0   IAB  0   Ectopic  0   Multiple  0   Live Births  0            Home Medications    Prior to Admission medications   Medication Sig Start Date End Date Taking? Authorizing Provider  furosemide (LASIX) 20 MG tablet Take 1 tablet (20 mg total) by mouth daily as needed. 08/27/21  Yes Whitmire, Dawn W, FNP  potassium chloride SA (KLOR-CON M) 20 MEQ tablet Take 1 tablet (20 mEq total) by mouth daily as needed. TAKE WITH LASIX 08/27/21  Yes Whitmire, Dawn W, FNP   tirzepatide [facility]) 12.5 MG/0.5ML Pen Inject 12.5 mg into the skin once a week. 09/14/21  Yes Whitmire, Dawn W, FNP  aspirin EC 81 MG tablet Take 1 tablet by mouth daily.    [provider]  lisinopril (ZESTRIL) 20 MG tablet Take 1 tablet (20 mg total) by mouth daily. for blood pressure Patient taking differently: Take 10 mg by mouth daily. for blood pressure 08/27/21   Whitmire, Dawn W, FNP  omeprazole (PRILOSEC OTC) 20 MG tablet Take 20 mg by mouth as needed.    [provider]  polyethylene glycol powder (MIRALAX) 17 GM/SCOOP powder Take 1 Container by mouth every other day.    [provider]  Prenatal Vit-Fe Fumarate-FA (MULTIVITAMIN-PRENATAL) 27-0.8 MG TABS tablet Take 1 tablet by mouth daily at 12 noon.    [provider]  Probiotic TBEC Take 1 capsule by mouth daily.    [provider]  Psyllium Fiber 0.52 g CAPS Take 10 capsules by mouth daily in the afternoon.  [provider]  tirzepatide Greggory Keen) 15 MG/0.5ML Pen Inject 15 mg into the skin once a week. 05/31/22     tirzepatide (MOUNJARO) 15 MG/0.5ML Pen Inject 15 mg into the skin once a week. 05/31/22   Helane Rima, DO  Vitamin D, Ergocalciferol, (DRISDOL) 1.25 MG (50000 UNIT) CAPS capsule Take 1 capsule (50,000 Units total) by mouth every 7 (seven) days. 07/08/21   Helane Rima, DO  Zinc 50 MG TABS Take 1 tablet by mouth every other day.    [provider]    Family History Family History  Problem Relation Age of Onset   Hypertension Mother    Thyroid disease Mother    Liver disease Mother    Alcoholism Mother    Obesity Mother    Hyperlipidemia Father    Breast cancer Paternal Grandmother    Diabetes Paternal Grandfather    Heart disease Paternal Grandfather    Heart attack Cousin 35   Heart attack Cousin 87    Social History Social History   Tobacco Use   Smoking status: Never   Smokeless tobacco: Never  Vaping Use   Vaping status: Never Used   Substance Use Topics   Alcohol use: Not Currently    Comment: Rarely   Drug use: Never     Allergies   Phentermine   Review of Systems Review of Systems  Constitutional:  Negative for chills and fever.  HENT:  Positive for congestion and sore throat. Negative for ear pain.   Eyes:  Negative for discharge and redness.  Respiratory:  Positive for cough. Negative for shortness of breath and wheezing.   Gastrointestinal:  Negative for abdominal pain, diarrhea, nausea and vomiting.  Musculoskeletal:  Positive for arthralgias.  Neurological:  Positive for headaches.     Physical Exam Triage Vital Signs ED Triage Vitals  Encounter Vitals Group     BP 12/28/22 1832 (!) 112/53     Systolic BP Percentile --      Diastolic BP Percentile --      Pulse Rate 12/28/22 1832 72     Resp 12/28/22 1832 (!) 96     Temp 12/28/22 1832 98.9 F (37.2 C)     Temp Source 12/28/22 1832 Oral     SpO2 12/28/22 1832 96 %     Weight 12/28/22 1830 254 lb (115.2 kg)     Height 12/28/22 1830 5\' 7"  (1.702 m)     Head Circumference --      Peak Flow --      Pain Score 12/28/22 1826 6     Pain Loc --      Pain Education --      Exclude from Growth Chart --    No data found.  Updated Vital Signs BP 132/69 (BP Location: Right Arm)   Pulse 72   Temp 98.9 F (37.2 C) (Oral)   Resp (!) 96   Ht 5\' 7"  (1.702 m)   Wt 254 lb (115.2 kg)   LMP 12/25/2022 (Exact Date)   SpO2 96%   BMI 39.78 kg/m      Physical Exam Vitals and nursing note reviewed.  Constitutional:      General: She is not in acute distress.    Appearance: Normal appearance. She is not ill-appearing.  HENT:     Head: Normocephalic and atraumatic.     Nose: Congestion present.     Mouth/Throat:     Mouth: Mucous membranes are moist.     Pharynx: No oropharyngeal  exudate or posterior oropharyngeal erythema.  Eyes:     Conjunctiva/sclera: Conjunctivae normal.  Cardiovascular:     Rate and Rhythm: Normal rate and regular  rhythm.     Heart sounds: Normal heart sounds. No murmur heard. Pulmonary:     Effort: Pulmonary effort is normal. No respiratory distress.     Breath sounds: Normal breath sounds. No wheezing, rhonchi or rales.  Skin:    General: Skin is warm and dry.  Neurological:     Mental Status: She is alert.  Psychiatric:        Mood and Affect: Mood normal.        Thought Content: Thought content normal.      UC Treatments / Results  Labs (all labs ordered are listed, but only abnormal results are displayed) Labs Reviewed  SARS CORONAVIRUS 2 (TAT 6-24 HRS)    EKG   Radiology No results found.  Procedures Procedures (including critical care time)  Medications Ordered in UC Medications - No data to display  Initial Impression / Assessment and Plan / UC Course  I have reviewed the triage vital signs and the nursing notes.  Pertinent labs & imaging results that were available during my care of the patient were reviewed by me and considered in my medical decision making (see chart for details).    Suspect viral etiology of symptoms.  Will screen for COVID.  Encouraged symptomatic treatment, increase fluids and rest with follow-up if no gradual improvement with any further concerns.  Final Clinical Impressions(s) / UC Diagnoses   Final diagnoses:  Viral upper respiratory tract infection   Discharge Instructions   None    ED Prescriptions   None    PDMP not reviewed this encounter.   Tomi Bamberger, PA-C 12/28/22 1936

## 2022-12-29 LAB — SARS CORONAVIRUS 2 (TAT 6-24 HRS): SARS Coronavirus 2: NEGATIVE

## 2023-01-17 ENCOUNTER — Other Ambulatory Visit (HOSPITAL_COMMUNITY)
Admission: RE | Admit: 2023-01-17 | Discharge: 2023-01-17 | Disposition: A | Discharge status: Home / Self Care | Payer: BC Managed Care – PPO | Source: Ambulatory Visit | Attending: Physician Assistant | Admitting: Physician Assistant

## 2023-01-17 ENCOUNTER — Encounter: Payer: Self-pay | Admitting: Physician Assistant

## 2023-01-17 ENCOUNTER — Ambulatory Visit (INDEPENDENT_AMBULATORY_CARE_PROVIDER_SITE_OTHER): Payer: BC Managed Care – PPO | Admitting: Physician Assistant

## 2023-01-17 VITALS — BP 104/72 | HR 62 | Temp 98.0°F | Ht 66.63 in | Wt 257.0 lb

## 2023-01-17 DIAGNOSIS — E669 Obesity, unspecified: Secondary | ICD-10-CM

## 2023-01-17 DIAGNOSIS — I10 Essential (primary) hypertension: Secondary | ICD-10-CM

## 2023-01-17 DIAGNOSIS — Z0000 Encounter for general adult medical examination without abnormal findings: Secondary | ICD-10-CM

## 2023-01-17 DIAGNOSIS — Z124 Encounter for screening for malignant neoplasm of cervix: Secondary | ICD-10-CM

## 2023-01-17 LAB — COMPREHENSIVE METABOLIC PANEL
ALT: 11 U/L (ref 0–35)
AST: 17 U/L (ref 0–37)
Albumin: 4.1 g/dL (ref 3.5–5.2)
Alkaline Phosphatase: 42 U/L (ref 39–117)
BUN: 8 mg/dL (ref 6–23)
CO2: 27 meq/L (ref 19–32)
Calcium: 9.2 mg/dL (ref 8.4–10.5)
Chloride: 104 meq/L (ref 96–112)
Creatinine, Ser: 0.58 mg/dL (ref 0.40–1.20)
GFR: 117.4 mL/min (ref >60.00)
Glucose, Bld: 67 mg/dL — ABNORMAL LOW (ref 70–99)
Potassium: 3.7 meq/L (ref 3.5–5.1)
Sodium: 138 meq/L (ref 135–145)
Total Bilirubin: 0.4 mg/dL (ref 0.2–1.2)
Total Protein: 6.9 g/dL (ref 6.0–8.3)

## 2023-01-17 LAB — CBC WITH DIFFERENTIAL/PLATELET
Basophils Absolute: 0 10*3/uL (ref 0.0–0.1)
Basophils Relative: 0.7 % (ref 0.0–3.0)
Eosinophils Absolute: 0.3 10*3/uL (ref 0.0–0.7)
Eosinophils Relative: 4.2 % (ref 0.0–5.0)
HCT: 41.1 % (ref 36.0–46.0)
Hemoglobin: 13.3 g/dL (ref 12.0–15.0)
Lymphocytes Relative: 30.3 % (ref 12.0–46.0)
Lymphs Abs: 2.1 10*3/uL (ref 0.7–4.0)
MCHC: 32.3 g/dL (ref 30.0–36.0)
MCV: 92 fL (ref 78.0–100.0)
Monocytes Absolute: 0.4 10*3/uL (ref 0.1–1.0)
Monocytes Relative: 5.1 % (ref 3.0–12.0)
Neutro Abs: 4.2 10*3/uL (ref 1.4–7.7)
Neutrophils Relative %: 59.7 % (ref 43.0–77.0)
Platelets: 259 10*3/uL (ref 150.0–400.0)
RBC: 4.47 Mil/uL (ref 3.87–5.11)
RDW: 13.4 % (ref 11.5–15.5)
WBC: 7 10*3/uL (ref 4.0–10.5)

## 2023-01-17 LAB — LIPID PANEL
Cholesterol: 136 mg/dL (ref 0–200)
HDL: 50.5 mg/dL (ref >39.00)
LDL Cholesterol: 70 mg/dL (ref 0–99)
NonHDL: 85.26
Total CHOL/HDL Ratio: 3
Triglycerides: 77 mg/dL (ref 0.0–149.0)
VLDL: 15.4 mg/dL (ref 0.0–40.0)

## 2023-01-17 LAB — HEMOGLOBIN A1C: Hgb A1c MFr Bld: 5 % (ref 4.6–6.5)

## 2023-01-17 LAB — TSH: TSH: 1.12 u[IU]/mL (ref 0.35–5.50)

## 2023-01-17 NOTE — Patient Instructions (Addendum)
It was great to see you!  Cut lasix to 10 mg daily --Let us know if your blood pressure, urine output or lightheadedness symptoms change negatively  Update your dental exam  Keep up all of the awesome work with your weight loss!  Please go to the lab for blood work.   Our office will call you with your results unless you have chosen to receive results via MyChart.  If your blood work is normal we will follow-up each year for physicals and as scheduled for chronic medical problems.  If anything is abnormal we will treat accordingly and get you in for a follow-up.  Take care,  Lelon Mast

## 2023-01-17 NOTE — Progress Notes (Signed)
Kathryn Chan is a 37 y.o. female and is here for a comprehensive physical exam.  HPI  Health Maintenance Due  Topic Date Due   Cervical Cancer Screening (HPV/Pap Cotest)  10/25/2017   Acute Concerns: None  Chronic Issues: Hypertension Reports that she is officially no longer on lisinopril as it was causing orthostatic hypotension and her blood pressure has remained well controlled without this She is currently taking 20 mg Lasix daily Notes that she still occasionally experiences lightheadedness when standing.  Denies excessive caffeine intake, stimulant usage, excessive alcohol intake, or increase in salt consumption. Denies chest pain, shortness of breath, blurred vision, or unusual headaches.   BP Readings from Last 3 Encounters:  01/17/23 104/72  12/28/22 132/69  09/20/22 106/70   Obesity Managed with 12.5 mg Mounjaro weekly Prescribed by [employer] -- Dr Helane Rima  Health Maintenance: Immunizations -- declined flu shot today Colonoscopy -- N/A.  Mammogram -- N/A.  PAP -- Last done 02/23/13. Results were normal. Repeating today.  Bone Density -- N/A.  Diet -- Healthy overall: 1600 cal daily, 125 g protein, 1 gal water, low sugar, limits bread and pasta. Exercise -- Regular exercise: 3 days/week, walks 30-45 minutes at lunch, staying active on the weekend.   Sleep habits -- Good sleep quality Mood -- Stable  UTD with dentist? - No, plans to schedule UTD with eye doctor? - Yes  Weight history: Wt Readings from Last 10 Encounters:  01/17/23 257 lb (116.6 kg)  12/28/22 254 lb (115.2 kg)  09/20/22 274 lb (124.3 kg)  07/08/21 (!) 356 lb (161.5 kg)  06/03/21 (!) 366 lb (166 kg)  05/06/21 (!) 371 lb (168.3 kg)  03/25/21 (!) 381 lb (172.8 kg)  03/04/21 (!) 397 lb (180.1 kg)  02/11/21 (!) 410 lb (186 kg)  01/19/21 (!) 421 lb (191 kg)   Body mass index is 40.7 kg/m. Patient's last menstrual period was 12/23/2022 (exact date).  Alcohol use:  reports that  she does not currently use alcohol.  Tobacco use:  Tobacco Use: Low Risk  (01/17/2023)   Patient History    Smoking Tobacco Use: Never    Smokeless Tobacco Use: Never    Passive Exposure: Not on file   Eligible for lung cancer screening? no     09/20/2022    8:51 AM  Depression screen PHQ 2/9  Decreased Interest 0  Down, Depressed, Hopeless 0  PHQ - 2 Score 0     Other providers/specialists: Patient Care Team: Jarold Motto, [place] as PCP - General (Physician Assistant)    PMHx, SurgHx, SocialHx, Medications, and Allergies were reviewed in the Visit Navigator and updated as appropriate.   Past Medical History:  Diagnosis Date   Back pain    Diabetes mellitus without complication (HCC)    Heart murmur    Hypertension    Hypomagnesemia    Lower extremity edema    Obesity, morbid, BMI 50 or higher (HCC)    Prediabetes    SOB (shortness of breath)    Vitamin D deficiency      Past Surgical History:  Procedure Laterality Date   CHOLECYSTECTOMY       Family History  Problem Relation Age of Onset   Hypertension Mother    Thyroid disease Mother    Liver disease Mother    Alcoholism Mother    Obesity Mother    Hyperlipidemia Father    Breast cancer Paternal Grandmother    Diabetes Paternal Grandfather    Heart disease Paternal  Grandfather    Heart attack Cousin 35   Heart attack Cousin 32    Social History   Tobacco Use   Smoking status: Never   Smokeless tobacco: Never  Vaping Use   Vaping status: Never Used  Substance Use Topics   Alcohol use: Not Currently    Comment: Rarely   Drug use: Never    Review of Systems:   Review of Systems  Constitutional:  Negative for chills, fever, malaise/fatigue and weight loss.  HENT:  Negative for hearing loss, sinus pain and sore throat.   Respiratory:  Negative for cough and hemoptysis.   Cardiovascular:  Negative for chest pain, palpitations, leg swelling and PND.  Gastrointestinal:  Negative for abdominal  pain, constipation, diarrhea, heartburn, nausea and vomiting.  Genitourinary:  Negative for dysuria, frequency and urgency.  Musculoskeletal:  Negative for back pain, myalgias and neck pain.  Skin:  Negative for itching and rash.  Neurological:  Negative for dizziness, tingling, seizures and headaches.  Endo/Heme/Allergies:  Negative for polydipsia.  Psychiatric/Behavioral:  Negative for depression. The patient is not nervous/anxious.      Objective:   BP 104/72 (BP Location: Left Arm, Patient Position: Sitting)   Pulse 62   Temp 98 F (36.7 C) (Temporal)   Ht 5' 6.63" (1.692 m)   Wt 257 lb (116.6 kg)   LMP 12/23/2022 (Exact Date)   SpO2 99%   BMI 40.70 kg/m  Body mass index is 40.7 kg/m.   General Appearance:    Alert, cooperative, no distress, appears stated age  Head:    Normocephalic, without obvious abnormality, atraumatic  Eyes:    PERRL, conjunctiva/corneas clear, EOM's intact, fundi    benign, both eyes  Ears:    Normal TM's and external ear canals, both ears  Nose:   Nares normal, septum midline, mucosa normal, no drainage    or sinus tenderness  Throat:   Lips, mucosa, and tongue normal; teeth and gums normal  Neck:   Supple, symmetrical, trachea midline, no adenopathy;    thyroid:  no enlargement/tenderness/nodules; no carotid   bruit or JVD  Back:     Symmetric, no curvature, ROM normal, no CVA tenderness  Lungs:     Clear to auscultation bilaterally, respirations unlabored  Chest Wall:    No tenderness or deformity   Heart:    Regular rate and rhythm, S1 and S2 normal, no murmur, rub or gallop  Breast Exam:    No tenderness, masses, or nipple abnormality  Abdomen:     Soft, non-tender, bowel sounds active all four quadrants,    no masses, no organomegaly  Genitalia:    Normal female without lesion, discharge or tenderness  Extremities:   Extremities normal, atraumatic, no cyanosis or edema  Pulses:   2+ and symmetric all extremities  Skin:   Skin color,  texture, turgor normal, no rashes or lesions  Lymph nodes:   Cervical, supraclavicular, and axillary nodes normal  Neurologic:   CNII-XII intact, normal strength, sensation and reflexes    throughout    Assessment/Plan:   Wellness examination Today patient counseled on age appropriate routine health concerns for screening and prevention, each reviewed and up to date or declined. Immunizations reviewed and up to date or declined. Labs ordered and reviewed. Risk factors for depression reviewed and negative. Hearing function and visual acuity are intact. ADLs screened and addressed as needed. Functional ability and level of safety reviewed and appropriate. Education, counseling and referrals performed based on assessed risks  today. Patient provided with a copy of personalized plan for preventive services.  Obesity, unspecified classification, unspecified obesity type, unspecified whether serious comorbidity present Continue excellent progress on weight loss and healthy lifestyle  Primary hypertension Normotensive We are going to try to reduce daily lasix to 10 mg daily to see if this helps with her occasional lightheadedness; her blood pressure is well controlled today and I think that she may benefit from this change She can reach out if she has issues with urinary output, side effect(s) or blood pressure increases  Pap smear for cervical cancer screening Completed today  I,Emily Lagle,acting as a scribe for [employer], PA.,have documented all relevant documentation on the behalf of Jarold Motto, PA,as directed by  Jarold Motto, PA while in the presence of Jarold Motto, [place].  I, Jarold Motto, [place], have reviewed all documentation for this visit. The documentation on 01/17/23 for the exam, diagnosis, procedures, and orders are all accurate and complete.  Jarold Motto, PA-C [facility]

## 2023-01-18 ENCOUNTER — Encounter: Payer: Self-pay | Admitting: Physician Assistant

## 2023-01-18 LAB — CYTOLOGY - PAP
Adequacy: ABSENT
Comment: NEGATIVE
Diagnosis: NEGATIVE
High risk HPV: NEGATIVE

## 2023-01-21 ENCOUNTER — Encounter: Payer: Self-pay | Admitting: Physician Assistant

## 2023-02-02 DIAGNOSIS — E559 Vitamin D deficiency, unspecified: Secondary | ICD-10-CM | POA: Diagnosis not present

## 2023-02-02 DIAGNOSIS — E1169 Type 2 diabetes mellitus with other specified complication: Secondary | ICD-10-CM | POA: Diagnosis not present

## 2023-02-02 DIAGNOSIS — R600 Localized edema: Secondary | ICD-10-CM | POA: Diagnosis not present

## 2023-02-02 DIAGNOSIS — Z9189 Other specified personal risk factors, not elsewhere classified: Secondary | ICD-10-CM | POA: Diagnosis not present

## 2023-09-22 DIAGNOSIS — E8889 Other specified metabolic disorders: Secondary | ICD-10-CM | POA: Diagnosis not present

## 2023-09-22 DIAGNOSIS — E118 Type 2 diabetes mellitus with unspecified complications: Secondary | ICD-10-CM | POA: Diagnosis not present

## 2023-09-22 DIAGNOSIS — R635 Abnormal weight gain: Secondary | ICD-10-CM | POA: Diagnosis not present

## 2023-09-22 DIAGNOSIS — R600 Localized edema: Secondary | ICD-10-CM | POA: Diagnosis not present

## 2023-12-22 ENCOUNTER — Encounter: Payer: Self-pay | Admitting: Physician Assistant

## 2023-12-23 NOTE — Telephone Encounter (Signed)
 Called pt to get scheduled and patient stated she would call back to get scheduled once has calendar available. I informed patient she could do this or just make via Mychart if easier. Pt verbalized understanding.

## 2023-12-23 NOTE — Telephone Encounter (Signed)
 Noted

## 2023-12-26 ENCOUNTER — Encounter: Payer: Self-pay | Admitting: Physician Assistant

## 2023-12-26 ENCOUNTER — Ambulatory Visit: Admitting: Physician Assistant

## 2023-12-26 VITALS — BP 130/88 | HR 57 | Temp 98.4°F | Ht 66.5 in | Wt 240.0 lb

## 2023-12-26 DIAGNOSIS — R600 Localized edema: Secondary | ICD-10-CM

## 2023-12-26 DIAGNOSIS — E559 Vitamin D deficiency, unspecified: Secondary | ICD-10-CM

## 2023-12-26 DIAGNOSIS — E669 Obesity, unspecified: Secondary | ICD-10-CM | POA: Diagnosis not present

## 2023-12-26 DIAGNOSIS — J029 Acute pharyngitis, unspecified: Secondary | ICD-10-CM

## 2023-12-26 LAB — POCT RAPID STREP A (OFFICE): Rapid Strep A Screen: NEGATIVE

## 2023-12-26 MED ORDER — BENZONATATE 200 MG PO CAPS: 200.0000 mg | ORAL_CAPSULE | Freq: Two times a day (BID) | ORAL | 0 refills | Status: DC | PRN

## 2023-12-26 MED ORDER — MOUNJARO 15 MG/0.5ML ~~LOC~~ SOAJ: 15.0000 mg | SUBCUTANEOUS | 0 refills | Status: DC

## 2023-12-26 MED ORDER — FUROSEMIDE 20 MG PO TABS: 20.0000 mg | ORAL_TABLET | Freq: Every day | ORAL | 0 refills | Status: DC | PRN

## 2023-12-26 MED ORDER — PHENTERMINE HCL 37.5 MG PO TABS: 37.5000 mg | ORAL_TABLET | Freq: Every day | ORAL | 2 refills | Status: DC

## 2023-12-26 MED ORDER — POTASSIUM CHLORIDE CRYS ER 20 MEQ PO TBCR: 20.0000 meq | EXTENDED_RELEASE_TABLET | Freq: Every day | ORAL | 0 refills | Status: DC | PRN

## 2023-12-26 MED ORDER — VITAMIN D (ERGOCALCIFEROL) 1.25 MG (50000 UNIT) PO CAPS: 50000.0000 [IU] | ORAL_CAPSULE | ORAL | 0 refills | Status: DC

## 2023-12-26 NOTE — Progress Notes (Signed)
 Kathryn Chan is a 38 y.o. female here for a new problem and follow up of pre-existing medical conditions.  History of Present Illness:   Chief Complaint  Patient presents with   Cough    Pt c/o dry cough, sore throat, body aches, chills, headache, started yesterday. Home COVID test Negative this morning.    Discussed the use of AI scribe software for clinical note transcription with the patient, who gave verbal consent to proceed.  History of Present Illness Kathryn Chan is a 38 year old female who presents with sore throat and congestion.  She woke up yesterday with a very sore throat, described as feeling like 'glass'. Today, the soreness has decreased but is accompanied by increased raspiness. Nasal congestion is present, with difficulty blowing out mucus. She feels extremely fatigued and experiences tightness and soreness in the back of her neck. No recent travel or contact with anyone sick, but there was COVID in her office two to three weeks ago. She has been taking Coricidin since yesterday morning and last night without noticeable improvement. She has not taken any medication today.   No urinary symptoms or diarrhea.  She also would like refill on medications. She is currently taking furosemide  20 mg daily as needed for lower extremity swelling, she will also take potassium kcl with this if needed.   Vitamin D  deficiency is currently controlled with high dose vitamin D  supplement weekly. She would like refill. She has been on this for years.  She is currently taking Mounjaro  15 mg weekly. This has been managed by Dr Geni Shutter with [facility]. She will be resuming care with her later this year and requires refill at this time. She was also receiving phentermine  37.5 mg tablets and would like refill on this as well. She has blood work from her most recent encounters -- we reviewed this today. Most recent liver, kidney, A1c all wnl. Most recent Vitamin D  is in the 30s.  Wt Readings  from Last 12 Encounters:  12/26/23 240 lb (108.9 kg)  01/17/23 257 lb (116.6 kg)  12/28/22 254 lb (115.2 kg)  09/20/22 274 lb (124.3 kg)  07/08/21 (!) 356 lb (161.5 kg)  06/03/21 (!) 366 lb (166 kg)  05/06/21 (!) 371 lb (168.3 kg)  03/25/21 (!) 381 lb (172.8 kg)  03/04/21 (!) 397 lb (180.1 kg)  02/11/21 (!) 410 lb (186 kg)  01/19/21 (!) 421 lb (191 kg)  01/01/21 (!) 432 lb (196 kg)      Past Medical History:  Diagnosis Date   Back pain    Diabetes mellitus without complication (HCC)    Heart murmur    Hypertension    Hypomagnesemia    Lower extremity edema    Obesity, morbid, BMI 50 or higher (HCC)    Prediabetes    SOB (shortness of breath)    Vitamin D  deficiency      Social History   Tobacco Use   Smoking status: Never   Smokeless tobacco: Never  Vaping Use   Vaping status: Never Used  Substance Use Topics   Alcohol use: Not Currently    Comment: Rarely   Drug use: Never    Past Surgical History:  Procedure Laterality Date   CHOLECYSTECTOMY      Family History  Problem Relation Age of Onset   Hypertension Mother    Thyroid disease Mother    Liver disease Mother    Alcoholism Mother    Obesity Mother    Hyperlipidemia Father  Breast cancer Paternal Grandmother    Diabetes Paternal Grandfather    Heart disease Paternal Grandfather    Heart attack Cousin 35   Heart attack Cousin 32    No Active Allergies  Current Medications:   Current Outpatient Medications:    aspirin EC 81 MG tablet, Take 1 tablet by mouth daily., Disp: , Rfl:    furosemide  (LASIX ) 20 MG tablet, Take 1 tablet (20 mg total) by mouth daily as needed. (Patient taking differently: Take 20 mg by mouth daily.), Disp: 90 tablet, Rfl: 0   omega-3 acid ethyl esters (LOVAZA) 1 g capsule, Take by mouth 2 (two) times daily., Disp: , Rfl:    omeprazole (PRILOSEC OTC) 20 MG tablet, Take 20 mg by mouth as needed., Disp: , Rfl:    phentermine  (ADIPEX-P ) 37.5 MG tablet, Take 18.75-37.5 mg  by mouth daily. (Patient taking differently: Take by mouth daily. Pt taking 1/2 tablet daily), Disp: , Rfl:    polyethylene glycol powder (MIRALAX) 17 GM/SCOOP powder, Take 1 Container by mouth every other day., Disp: , Rfl:    potassium chloride  SA (KLOR-CON  M) 20 MEQ tablet, Take 1 tablet (20 mEq total) by mouth daily as needed. TAKE WITH LASIX , Disp: 90 tablet, Rfl: 0   Prenatal Vit-Fe Fumarate-FA (MULTIVITAMIN-PRENATAL) 27-0.8 MG TABS tablet, Take 1 tablet by mouth daily at 12 noon., Disp: , Rfl:    Probiotic TBEC, Take 1 capsule by mouth daily., Disp: , Rfl:    Psyllium Fiber 0.52 g CAPS, Take 10 capsules by mouth daily in the afternoon., Disp: , Rfl:    tirzepatide  (MOUNJARO ) 15 MG/0.5ML Pen, Inject 15 mg into the skin once a week., Disp: 6 mL, Rfl: 0   tirzepatide  (MOUNJARO ) 15 MG/0.5ML Pen, Inject 15 mg into the skin once a week., Disp: 6 mL, Rfl: 1   Vitamin D , Ergocalciferol , (DRISDOL ) 1.25 MG (50000 UNIT) CAPS capsule, Take 1 capsule (50,000 Units total) by mouth every 7 (seven) days., Disp: 12 capsule, Rfl: 0   Zinc 50 MG TABS, Take 1 tablet by mouth every other day., Disp: , Rfl:    Review of Systems:   Negative unless otherwise specified per HPI.  Vitals:   Vitals:   12/26/23 1136  BP: 130/88  Pulse: (!) 57  Temp: 98.4 F (36.9 C)  TempSrc: Temporal  SpO2: 99%  Weight: 240 lb (108.9 kg)  Height: 5' 6.5 (1.689 m)     Body mass index is 38.16 kg/m.  Physical Exam:   Physical Exam Vitals and nursing note reviewed.  Constitutional:      General: She is not in acute distress.    Appearance: She is well-developed. She is not ill-appearing or toxic-appearing.  HENT:     Head: Normocephalic and atraumatic.     Right Ear: Ear canal and external ear normal. A middle ear effusion is present. Tympanic membrane is not erythematous, retracted or bulging.     Left Ear: Ear canal and external ear normal. A middle ear effusion is present. Tympanic membrane is not erythematous,  retracted or bulging.     Nose: Nose normal.     Right Sinus: No maxillary sinus tenderness or frontal sinus tenderness.     Left Sinus: No maxillary sinus tenderness or frontal sinus tenderness.     Mouth/Throat:     Pharynx: Uvula midline. Posterior oropharyngeal erythema present.  Eyes:     General: Lids are normal.     Conjunctiva/sclera: Conjunctivae normal.  Neck:     Trachea:  Trachea normal.  Cardiovascular:     Rate and Rhythm: Normal rate and regular rhythm.     Pulses: Normal pulses.     Heart sounds: Normal heart sounds, S1 normal and S2 normal.  Pulmonary:     Effort: Pulmonary effort is normal.     Breath sounds: Normal breath sounds. No decreased breath sounds, wheezing, rhonchi or rales.  Lymphadenopathy:     Cervical: No cervical adenopathy.  Skin:    General: Skin is warm and dry.  Neurological:     Mental Status: She is alert.     GCS: GCS eye subscore is 4. GCS verbal subscore is 5. GCS motor subscore is 6.  Psychiatric:        Speech: Speech normal.        Behavior: Behavior normal. Behavior is cooperative.    Results for orders placed or performed in visit on 12/26/23  POCT rapid strep A  Result Value Ref Range   Rapid Strep A Screen Negative Negative    Assessment and Plan:   Assessment and Plan Assessment & Plan Sore throat Negative strep test indicates likely viral etiology.  - Prescribe Tessalon  Perles for cough. - Instruct to monitor symptoms and contact if persisting beyond 7-10 days for potential antibiotics. - Provide work absence note until Wednesday.  Bilateral lower extremity swelling Controlled with current regimen Most recent blood work reviewed and wnl Continue furosemide  20 mg and KCL 20 mEq as needed   Vitamin D  deficiency Chronic deficiency managed with high-dose supplementation. Recent levels low off supplementation. - Continue high-dose vitamin D  supplementation.  Obesity Continues to make excellent progress Will refill  her Mounjaro  15 mg weekly and phentermine  37.5 mg daily PDMP reviewed during this encounter.   She plans to follow up with new PCP in Dec for all ongoing chronic conditions.   Lucie Buttner, PA-C

## 2024-01-23 ENCOUNTER — Encounter: Payer: BC Managed Care – PPO | Admitting: Physician Assistant

## 2024-03-17 ENCOUNTER — Other Ambulatory Visit: Payer: Self-pay | Admitting: Physician Assistant

## 2024-03-17 DIAGNOSIS — E559 Vitamin D deficiency, unspecified: Secondary | ICD-10-CM

## 2024-03-19 ENCOUNTER — Encounter: Payer: Self-pay | Admitting: Family Medicine

## 2024-03-19 ENCOUNTER — Ambulatory Visit: Admitting: Family Medicine

## 2024-03-19 VITALS — BP 116/72 | HR 54 | Ht 67.0 in | Wt 241.2 lb

## 2024-03-19 DIAGNOSIS — E66812 Obesity, class 2: Secondary | ICD-10-CM

## 2024-03-19 DIAGNOSIS — R600 Localized edema: Secondary | ICD-10-CM

## 2024-03-19 DIAGNOSIS — Z6837 Body mass index (BMI) 37.0-37.9, adult: Secondary | ICD-10-CM

## 2024-03-19 DIAGNOSIS — E559 Vitamin D deficiency, unspecified: Secondary | ICD-10-CM | POA: Diagnosis not present

## 2024-03-19 DIAGNOSIS — E119 Type 2 diabetes mellitus without complications: Secondary | ICD-10-CM | POA: Diagnosis not present

## 2024-03-19 DIAGNOSIS — Z7985 Long-term (current) use of injectable non-insulin antidiabetic drugs: Secondary | ICD-10-CM

## 2024-03-19 DIAGNOSIS — R632 Polyphagia: Secondary | ICD-10-CM

## 2024-03-19 MED ORDER — MOUNJARO 15 MG/0.5ML ~~LOC~~ SOAJ: 15.0000 mg | SUBCUTANEOUS | 0 refills | Status: AC

## 2024-03-19 MED ORDER — FUROSEMIDE 20 MG PO TABS: 20.0000 mg | ORAL_TABLET | Freq: Every day | ORAL | 0 refills | Status: AC | PRN

## 2024-03-19 MED ORDER — VITAMIN D (ERGOCALCIFEROL) 1.25 MG (50000 UNIT) PO CAPS: 50000.0000 [IU] | ORAL_CAPSULE | ORAL | 0 refills | Status: DC

## 2024-03-19 MED ORDER — POTASSIUM CHLORIDE CRYS ER 20 MEQ PO TBCR: 20.0000 meq | EXTENDED_RELEASE_TABLET | Freq: Every day | ORAL | 0 refills | Status: DC | PRN

## 2024-03-19 MED ORDER — PHENTERMINE HCL 37.5 MG PO TABS: 37.5000 mg | ORAL_TABLET | Freq: Every day | ORAL | 2 refills | Status: AC

## 2024-03-19 NOTE — Progress Notes (Signed)
 1. Bilateral lower extremity edema The current medical regimen is effective;  continue present plan and medications. - furosemide  (LASIX ) 20 MG tablet; Take 1 tablet (20 mg total) by mouth daily as needed.  Dispense: 90 tablet; Refill: 0 - potassium chloride  SA (KLOR-CON  M) 20 MEQ tablet; Take 1 tablet (20 mEq total) by mouth daily as needed. TAKE WITH LASIX   Dispense: 90 tablet; Refill: 0  2. Vitamin D  deficiency The current medical regimen is effective;  continue present plan and medications. - Vitamin D , Ergocalciferol , (DRISDOL ) 1.25 MG (50000 UNIT) CAPS capsule; Take 1 capsule (50,000 Units total) by mouth every 7 (seven) days.  Dispense: 12 capsule; Refill: 0  3. Type 2 diabetes mellitus without complication, without long-term current use of insulin  (HCC) (Primary) Diabetes Mellitus: At goal. Medication: Mounjaro . Issues reviewed: blood sugar goals, complications of diabetes mellitus, hypoglycemia prevention and treatment, exercise, and nutrition. Plan: Well controlled.  No signs of complications, medication side effects, or red flags.  Continue current regimen.   - tirzepatide  (MOUNJARO ) 15 MG/0.5ML Pen; Inject 15 mg into the skin once a week.  Dispense: 6 mL; Refill: 0  4. Polyphagia The current medical regimen is effective;  continue present plan and medications. - phentermine  (ADIPEX-P ) 37.5 MG tablet; Take 1 tablet (37.5 mg total) by mouth daily.  Dispense: 30 tablet; Refill: 2  5. Class 2 severe obesity with serious comorbidity and body mass index (BMI) of 37.0 to 37.9 in adult, unspecified obesity type Doing very well. Continue current regimen. Medications refilled today.   Meds ordered this encounter  Medications   furosemide  (LASIX ) 20 MG tablet    Sig: Take 1 tablet (20 mg total) by mouth daily as needed.    Dispense:  90 tablet    Refill:  0   phentermine  (ADIPEX-P ) 37.5 MG tablet    Sig: Take 1 tablet (37.5 mg total) by mouth daily.    Dispense:  30 tablet     Refill:  2   potassium chloride  SA (KLOR-CON  M) 20 MEQ tablet    Sig: Take 1 tablet (20 mEq total) by mouth daily as needed. TAKE WITH LASIX     Dispense:  90 tablet    Refill:  0   tirzepatide  (MOUNJARO ) 15 MG/0.5ML Pen    Sig: Inject 15 mg into the skin once a week.    Dispense:  6 mL    Refill:  0   Vitamin D , Ergocalciferol , (DRISDOL ) 1.25 MG (50000 UNIT) CAPS capsule    Sig: Take 1 capsule (50,000 Units total) by mouth every 7 (seven) days.    Dispense:  12 capsule    Refill:  0   Assessment and Plan Assessment & Plan Obesity Significant weight loss achieved with phentermine , high-protein diet, and increased activity. She is satisfied with progress and aware of maintaining muscle mass. - Continue phentermine  as prescribed. - Maintain high-protein diet. - Encouraged regular physical activity. - Scheduled follow-up in four months.  Geni Shutter, DO, MS, FAAFP, Dipl. [facility] [address] [place], [address] Dept: [phone] Dept Fax: [phone]  Subjective:   Patient is well-known to me from previous care setting and is establishing care in this system with me as PCP. Prior records reviewed when available. Chart updated today with reconciliation of problem list, medications, allergies, and relevant history. Preventive care and chronic disease status reviewed. Portions of historical chart may remain incomplete; will update on an ongoing basis as clinically indicated.  Discussed the  use of AI scribe software for clinical note transcription with the patient, who gave verbal consent to proceed. History of Present Illness Kathryn Chan is a 38 year old female who presents for follow-up on her weight management and medication regimen.  Obesity and weight management - Weight reduced from 480 pounds to 239.2 pounds through lifestyle modifications, Mounjaro , and phentermine  therapy - Goal weight is 185 pounds at a height of  5'7 - Maintains an active lifestyle, recently walking 24 miles in one week without fatigue - Improved relationship with food, with decreased carbohydrate cravings and satisfaction with smaller portions - Targets daily protein intake of 90 to 100 grams and caloric intake of 1450 calories  Peripheral edema - Experiences fluid retention in the feet - Edema managed with Lasix ; attempts to reduce the dose were ineffective, requiring continuation of the regular dose - Body does not efficiently eliminate fluid without diuretic therapy  Cardiac murmur - Faint heart murmur present since birth without associated symptoms or complications  Laboratory findings - Last laboratory evaluation in June 2025 showed stable results: hemoglobin 13.5, platelets 215, cholesterol 127  Review of Systems: Negative, with the exception of above mentioned in HPI.  Current Outpatient Medications:    aspirin EC 81 MG tablet, Take 1 tablet by mouth daily., Disp: , Rfl:    omega-3 acid ethyl esters (LOVAZA) 1 g capsule, Take by mouth 2 (two) times daily., Disp: , Rfl:    omeprazole (PRILOSEC OTC) 20 MG tablet, Take 20 mg by mouth as needed., Disp: , Rfl:    polyethylene glycol powder (MIRALAX) 17 GM/SCOOP powder, Take 1 Container by mouth every other day., Disp: , Rfl:    Prenatal Vit-Fe Fumarate-FA (MULTIVITAMIN-PRENATAL) 27-0.8 MG TABS tablet, Take 1 tablet by mouth daily at 12 noon., Disp: , Rfl:    Probiotic TBEC, Take 1 capsule by mouth daily., Disp: , Rfl:    Psyllium Fiber 0.52 g CAPS, Take 10 capsules by mouth daily in the afternoon., Disp: , Rfl:    Zinc 50 MG TABS, Take 1 tablet by mouth every other day., Disp: , Rfl:    furosemide  (LASIX ) 20 MG tablet, Take 1 tablet (20 mg total) by mouth daily as needed., Disp: 90 tablet, Rfl: 0   phentermine  (ADIPEX-P ) 37.5 MG tablet, Take 1 tablet (37.5 mg total) by mouth daily., Disp: 30 tablet, Rfl: 2   potassium chloride  SA (KLOR-CON  M) 20 MEQ tablet, Take 1 tablet (20  mEq total) by mouth daily as needed. TAKE WITH LASIX , Disp: 90 tablet, Rfl: 0   tirzepatide  (MOUNJARO ) 15 MG/0.5ML Pen, Inject 15 mg into the skin once a week., Disp: 6 mL, Rfl: 0   Vitamin D , Ergocalciferol , (DRISDOL ) 1.25 MG (50000 UNIT) CAPS capsule, Take 1 capsule (50,000 Units total) by mouth every 7 (seven) days., Disp: 12 capsule, Rfl: 0   Objective:   BP 116/72 (BP Location: Left Arm, Cuff Size: Large)   Pulse (!) 54   Ht 5' 7 (1.702 m)   Wt 241 lb 3.2 oz (109.4 kg)   BMI 37.78 kg/m   Wt Readings from Last 3 Encounters:  03/19/24 241 lb 3.2 oz (109.4 kg)  12/26/23 240 lb (108.9 kg)  01/17/23 257 lb (116.6 kg)   Physical Exam Constitutional:      General: She is not in acute distress.    Appearance: She is well-developed. She is obese.  HENT:     Head: Normocephalic and atraumatic.  Eyes:     Conjunctiva/sclera: Conjunctivae normal.  Cardiovascular:     Rate and Rhythm: Normal rate and regular rhythm.     Heart sounds: Murmur heard.  Pulmonary:     Effort: Pulmonary effort is normal.     Breath sounds: Normal breath sounds.  Musculoskeletal:     Cervical back: Normal range of motion and neck supple.  Neurological:     General: No focal deficit present.     Mental Status: She is alert and oriented to person, place, and time.  Psychiatric:        Mood and Affect: Mood normal.        Behavior: Behavior normal.    Lab Results  Component Value Date   CREATININE 0.58 01/17/2023   BUN 8 01/17/2023   NA 138 01/17/2023   K 3.7 01/17/2023   CL 104 01/17/2023   CO2 27 01/17/2023   Lab Results  Component Value Date   ALT 11 01/17/2023   AST 17 01/17/2023   ALKPHOS 42 01/17/2023   BILITOT 0.4 01/17/2023   Lab Results  Component Value Date   HGBA1C 5.0 01/17/2023   HGBA1C 5.4 03/04/2021   HGBA1C 6.3 (H) 10/23/2020   HGBA1C 6.1 (H) 10/16/2019   HGBA1C 6.1 (H) 01/25/2018   Lab Results  Component Value Date   INSULIN  32.9 (H) 03/04/2021   INSULIN  93.3 (H)  10/23/2020   INSULIN  86.7 (H) 10/16/2019   Lab Results  Component Value Date   TSH 1.12 01/17/2023   Lab Results  Component Value Date   CHOL 136 01/17/2023   HDL 50.50 01/17/2023   LDLCALC 70 01/17/2023   TRIG 77.0 01/17/2023   CHOLHDL 3 01/17/2023   Lab Results  Component Value Date   VD25OH 51.0 03/04/2021   VD25OH 25.6 (L) 10/23/2020   VD25OH 34.5 10/16/2019   Lab Results  Component Value Date   WBC 7.0 01/17/2023   HGB 13.3 01/17/2023   HCT 41.1 01/17/2023   MCV 92.0 01/17/2023   PLT 259.0 01/17/2023   Lab Results  Component Value Date   IRON 65 10/23/2020   TIBC 319 10/23/2020   FERRITIN 94 10/23/2020

## 2024-05-24 ENCOUNTER — Other Ambulatory Visit: Payer: Self-pay | Admitting: Family Medicine

## 2024-05-24 DIAGNOSIS — R600 Localized edema: Secondary | ICD-10-CM

## 2024-05-24 DIAGNOSIS — E559 Vitamin D deficiency, unspecified: Secondary | ICD-10-CM

## 2024-08-06 ENCOUNTER — Encounter: Admitting: Family Medicine
